# Patient Record
Sex: Female | Born: 1975 | Race: Black or African American | Hispanic: No | Marital: Married | Smoking: Never smoker
Health system: Southern US, Community
[De-identification: ages and names within clinical notes are randomized; demographics above are authoritative.]

## PROBLEM LIST (undated history)

## (undated) DIAGNOSIS — I10 Essential (primary) hypertension: Secondary | ICD-10-CM

## (undated) DIAGNOSIS — D219 Benign neoplasm of connective and other soft tissue, unspecified: Secondary | ICD-10-CM

## (undated) HISTORY — DX: Essential (primary) hypertension: I10

## (undated) HISTORY — DX: Benign neoplasm of connective and other soft tissue, unspecified: D21.9

---

## 2001-09-06 ENCOUNTER — Other Ambulatory Visit: Admission: RE | Admit: 2001-09-06 | Discharge: 2001-09-06 | Payer: Self-pay | Admitting: Gynecology

## 2003-04-01 ENCOUNTER — Other Ambulatory Visit: Admission: RE | Admit: 2003-04-01 | Discharge: 2003-04-01 | Payer: Self-pay | Admitting: Gynecology

## 2003-07-11 DIAGNOSIS — I10 Essential (primary) hypertension: Secondary | ICD-10-CM

## 2003-07-11 HISTORY — DX: Essential (primary) hypertension: I10

## 2003-09-16 ENCOUNTER — Inpatient Hospital Stay (HOSPITAL_COMMUNITY): Admission: AD | Admit: 2003-09-16 | Discharge: 2003-09-20 | Payer: Self-pay | Admitting: Gynecology

## 2003-09-17 ENCOUNTER — Encounter (INDEPENDENT_AMBULATORY_CARE_PROVIDER_SITE_OTHER): Payer: Self-pay | Admitting: *Deleted

## 2003-11-11 ENCOUNTER — Other Ambulatory Visit: Admission: RE | Admit: 2003-11-11 | Discharge: 2003-11-11 | Payer: Self-pay | Admitting: Gynecology

## 2005-01-11 ENCOUNTER — Other Ambulatory Visit: Admission: RE | Admit: 2005-01-11 | Discharge: 2005-01-11 | Payer: Self-pay | Admitting: Gynecology

## 2006-02-23 ENCOUNTER — Other Ambulatory Visit: Admission: RE | Admit: 2006-02-23 | Discharge: 2006-02-23 | Payer: Self-pay | Admitting: Gynecology

## 2006-03-27 ENCOUNTER — Encounter: Admission: RE | Admit: 2006-03-27 | Discharge: 2006-03-27 | Payer: Self-pay | Admitting: Gynecology

## 2007-06-13 ENCOUNTER — Other Ambulatory Visit: Admission: RE | Admit: 2007-06-13 | Discharge: 2007-06-13 | Payer: Self-pay | Admitting: Gynecology

## 2008-06-26 ENCOUNTER — Encounter: Payer: Self-pay | Admitting: Women's Health

## 2008-06-26 ENCOUNTER — Ambulatory Visit (HOSPITAL_BASED_OUTPATIENT_CLINIC_OR_DEPARTMENT_OTHER): Admission: RE | Admit: 2008-06-26 | Discharge: 2008-06-26 | Payer: Self-pay | Admitting: Gynecology

## 2008-06-26 ENCOUNTER — Ambulatory Visit: Payer: Self-pay | Admitting: Diagnostic Radiology

## 2008-06-26 ENCOUNTER — Ambulatory Visit: Payer: Self-pay | Admitting: Women's Health

## 2008-06-26 ENCOUNTER — Other Ambulatory Visit: Admission: RE | Admit: 2008-06-26 | Discharge: 2008-06-26 | Payer: Self-pay | Admitting: Gynecology

## 2009-07-22 ENCOUNTER — Ambulatory Visit: Payer: Self-pay | Admitting: Women's Health

## 2009-07-22 ENCOUNTER — Other Ambulatory Visit: Admission: RE | Admit: 2009-07-22 | Discharge: 2009-07-22 | Payer: Self-pay | Admitting: Gynecology

## 2010-07-31 ENCOUNTER — Encounter: Payer: Self-pay | Admitting: Gynecology

## 2010-08-03 ENCOUNTER — Ambulatory Visit
Admission: RE | Admit: 2010-08-03 | Discharge: 2010-08-03 | Payer: Self-pay | Source: Home / Self Care | Attending: Women's Health | Admitting: Women's Health

## 2010-08-03 ENCOUNTER — Other Ambulatory Visit
Admission: RE | Admit: 2010-08-03 | Discharge: 2010-08-03 | Payer: Self-pay | Source: Home / Self Care | Admitting: Gynecology

## 2010-08-03 ENCOUNTER — Other Ambulatory Visit: Payer: Self-pay | Admitting: Women's Health

## 2010-11-25 NOTE — Op Note (Signed)
NAME:  Theresa Noble, Theresa Noble [facility]                ACCOUNT NO.:  [id number]   MEDICAL RECORD NO.:  [id number]                   PATIENT TYPE:  INP   LOCATION:  9161                                 FACILITY:  [facility]   PHYSICIAN:  Timothy P. Fontaine, M.D.           DATE OF BIRTH:  May 28, 1976   DATE OF PROCEDURE:  09/17/2003  DATE OF DISCHARGE:                                 OPERATIVE REPORT   PREOPERATIVE DIAGNOSIS:  1. Pregnancy at term.  2. Failure to progress.  3. Intrapartum fever.   POSTOPERATIVE DIAGNOSIS:  1. Pregnancy at term.  2. Failure to progress.  3. Intrapartum fever.   PROCEDURE:  Primary low transverse cervical cesarean section.   SURGEON:  Timothy P. Fontaine, M.D.   ASSISTANT:  Scrub technician   ANESTHESIA:  Epidural.   ESTIMATED BLOOD LOSS:  Less than 500 mL.   COMPLICATIONS:  None.   SPECIMENS:  Samples of cord blood, placenta, umbilical cord.   FINDINGS:  At 1356, normal female, Apgars 9 and 9, nuchal cord x 1 loose  noted.  Pelvic anatomy noted to be normal.  There was a small subserosal  posterior uterine wall myoma 5 mm.  Bilateral fallopian tubes and ovaries  were grossly normal.   PROCEDURE:  The patient was taken to the operating room, had her epidural  catheter dosed, received an abdominal preparation with Betadine solution,  Foley catheter previously placed in labor and delivery, the patient was  draped in the usual fashion.  After assuring adequate anesthesia, the  abdomen was sharply entered through a Pfannenstiel incision achieving  adequate hemostasis at all levels.  The bladder flap was then sharply and  bluntly developed without difficulty and the uterus was sharply entered and  the lower uterine segment bluntly extended laterally.  The bulging membranes  were ruptured, the fluid noted to be clear.  The infants head was delivered  through the incision and the nares and mouth were suctioned.  The nuchal  cord was reduced.  The rest of the  infant was delivered.  The cord was  doubly clamped and cut.  The infant was handed to pediatrics in attendance.  Samples of cord blood were obtained.  The placenta was spontaneously  extruded and noted to be intact and was sent to pathology.  The uterus was  exteriorized, the endometrial cavity explored with a sponge to remove all  placental membrane fragments.  The patient received 1 gram Ancef  prophylaxis.  The uterine incision was then closed in two layers using 0  Vicryl suture, first in a running interlocking stitch followed by an  imbricating stitch.  The uterus was returned to the abdomen which was  copiously irrigated.  A mid incisional point was noted to persistently ooze  and a figure-of-eight suture was placed to achieve excellent hemostasis.  The anterior fascia was reapproximated using 0 Vicryl suture in a running  stitch, the subcutaneous tissue were irrigated, and the skin was  reapproximated using 4-0  Vicryl in a running subcuticular stitch.  Steri-  Strips with Benzoin applied.  A sterile dressing was applied.  The patient  was taken to the recovery room in good condition having tolerated the  procedure well.                                               Timothy P. Audie Box, M.D.    TPF/MEDQ  D:  09/17/2003  T:  09/18/2003  Job:  [id number]

## 2010-11-25 NOTE — H&P (Signed)
NAME:  Theresa Noble, Theresa Noble [facility]                ACCOUNT NO.:  [id number]   MEDICAL RECORD NO.:  [id number]                   PATIENT TYPE:  INP   LOCATION:  9161                                 FACILITY:  [facility]   PHYSICIAN:  Juan H. Lily Peer, M.D.             DATE OF BIRTH:  [birth year]   DATE OF ADMISSION:  09/16/2003  DATE OF DISCHARGE:                                HISTORY & PHYSICAL   CHIEF COMPLAINT:  Elevated blood pressure and swelling.   HISTORY:  The patient is a 35 year old gravida 1, para 0, with a last  menstrual period December 26, 2002, estimated date of confinement October 02, 2003.  Patient currently 53 weeks' estimated gestational age, was seen in  the office for a routine prenatal visit today.  He weight was 183 pounds and  her blood pressure was found to be 130/90, and it was repeated x2.  She has  1+ proteinuria.  Review of her record indicated that since approximately 31  weeks she had been starting to complain of increasing amount of swelling,  and she has gained a total of 13 pounds over the past eight weeks, overall  weight gain in her pregnancy has been 41 pounds.  She denied any right upper  quadrant pain, no visual disturbances or headaches, and she is being  admitted for induction.  Her cervix was long and closed, 50%, -3, and vertex  presentation.  Review of record indicated that she had had a PIH panel done  on March 2 with normal pregnancy parameters.  She does have positive group B  strep culture.   PAST MEDICAL HISTORY:  The patient denies any allergies.  She does have  positive group B strep with this pregnancy.  She had declined cystic  fibrosis screen as well as alpha-fetoprotein.  She denies any other medical  problems.  She does have a normal hemoglobin electrophoresis that was done  due to her ethnicity.   REVIEW OF SYSTEMS:  See [employer] form.   PHYSICAL EXAMINATION:  VITAL SIGNS:  As described above.  HEENT:  Unremarkable.  NECK:  Supple,  trachea midline, no carotid bruits, no thyromegaly.  CHEST:  Lungs clear to auscultation without rhonchi or wheezes.  CARDIAC:  Regular rate and rhythm, no murmurs or gallops.  BREASTS:  Exam not done.  ABDOMEN:  Gravid uterus, vertex presentation by [employer].  Positive  fetal heart tones.  PELVIC:  Cervix long and closed, posterior, -3 station.  EXTREMITIES:  1+ pitting edema.  DTR 1+.  Negative clonus.   PRENATAL LABORATORY DATA:  B positive blood type, negative antibody screen.  VDRL was nonreactive.  Rubella with evidence of immunity.  Hepatitis B  surface antigen and HIV were negative.  Alpha-fetoprotein was declined, as  was cystic fibrosis screen.  She had a normal hemoglobin electrophoresis.  Diabetes screen was normal.  GBS culture was positive.   ASSESSMENT:  A 35 year old gravida 1, para 0, at 58 weeks' estimated  gestational age with pregnancy-induced hypertension.  Will be admitted for  induction via Cervidil for cervical ripening.  Admitting PIH labs will be  drawn and after 12 hours, will proceed then with Pitocin high-dose for  induction.  Risks and benefits and pros and cons  discussed.  Also, due to the fast that she has a positive group B strep  culture, will start her during her active phase labor on penicillin G per  protocol.   PLAN:  As per assessment above.                                               Juan H. Lily Peer, M.D.    JHF/MEDQ  D:  09/16/2003  T:  09/16/2003  Job:  [id number]

## 2010-11-25 NOTE — Discharge Summary (Signed)
NAME:  Theresa Noble, BELKNAP [facility]                ACCOUNT NO.:  [id number]   MEDICAL RECORD NO.:  [id number]                   PATIENT TYPE:  INP   LOCATION:  9104                                 [facility]ACILITY:  [facility]   PHYSICIAN:  Ivor Costa. Farrel Gobble, M.D.              DATE OF BIRTH:  1975/10/26   DATE OF ADMISSION:  09/16/2003  DATE OF DISCHARGE:  09/20/2003                                 DISCHARGE SUMMARY   DISCHARGE DIAGNOSES:  1. Intrauterine pregnancy at 38 weeks, delivered.  2. Pregnancy-induced hypertension.  3. Positive group B Streptococcus.  4. Failure to progress.  5. Intrapartum fever.  6. Status post primary low transverse cervical cesarean section by Dr.     Colin Broach on September 17, 2003.   HISTORY:  This is a 27-years-of-age female gravida 1 para 0 with an EDC of  October 02, 2003.  Prenatal course had been complicated by PIH.  She did have  a positive group B strep.   HOSPITAL COURSE:  On September 16, 2003 the patient had been seen at the office,  was found to have blood pressure 130/90 with edema and 1+ proteinuria, and  therefore the patient was admitted for induction, was given Cervidil, and  after 12 hours Pitocin was begun.  Because of the positive group B strep was  begun on IV penicillin per protocol.  On September 17, 2003 the patient's  temperature was 101.6.  Subsequently, the patient was diagnosed with failure  to progress and intrapartum fever and therefore the patient underwent a  primary low transverse cervical cesarean section by Dr. Colin Broach on  September 17, 2003 and underwent delivery of a female, Apgars of 9 and 9, weight  of 6 pounds 6 ounces.  There were no complications.  Postoperatively she did  have a Tmax of 103 on September 18, 2003; otherwise, was doing well.  On September 19, 2003 the patient was afebrile, voiding, in stable condition, and  continued to do well and felt stable for discharge to home on September 20, 2003  in satisfactory condition.   ACCESSORY CLINICAL FINDINGS/LABORATORY DATA:  The patient is B positive,  rubella immune.  On September 18, 2003 hemoglobin was 10.5.   DISPOSITION:  The patient was discharged to home, follow up in 6 weeks, if  had any problem prior to that time to be seen in the office, was given  prescription for Percocet p.r.n. pain #30.     Susa Loffler, P.A.                    Ivor Costa. Farrel Gobble, M.D.    TSG/MEDQ  D:  10/12/2003  T:  10/12/2003  Job:  [id number]

## 2011-06-09 ENCOUNTER — Emergency Department (HOSPITAL_BASED_OUTPATIENT_CLINIC_OR_DEPARTMENT_OTHER)
Admission: EM | Admit: 2011-06-09 | Discharge: 2011-06-10 | Disposition: A | Discharge status: Home / Self Care | Payer: BC Managed Care – PPO | Attending: Emergency Medicine | Admitting: Emergency Medicine

## 2011-06-09 DIAGNOSIS — H5710 Ocular pain, unspecified eye: Secondary | ICD-10-CM | POA: Insufficient documentation

## 2011-06-09 DIAGNOSIS — H1130 Conjunctival hemorrhage, unspecified eye: Secondary | ICD-10-CM | POA: Insufficient documentation

## 2011-06-09 MED ORDER — FLUORESCEIN SODIUM 1 MG OP STRP
ORAL_STRIP | OPHTHALMIC | Status: AC
  Filled 2011-06-09: qty 1

## 2011-06-09 MED ORDER — TETRACAINE HCL 0.5 % OP SOLN
OPHTHALMIC | Status: AC
  Filled 2011-06-09: qty 2

## 2011-06-09 NOTE — ED Notes (Signed)
Pt states that she saw a brown spot on her eye about a week ago, today she presents with red area at the top of her R eye.  Pt states that she does not have a regular eye doctor.  No visual deficits reported.

## 2011-06-10 NOTE — ED Notes (Signed)
MD at bedside. 

## 2011-06-10 NOTE — ED Provider Notes (Addendum)
History     CSN: [id number] Arrival date & time: 06/09/2011 11:45 PM   First MD Initiated Contact with Patient 06/10/11 0043      Chief Complaint  Patient presents with  . Eye Problem     HPI  35yoF previously healthy presents with a complaint. The patient states that approximately one week ago she saw a red spot on her conjunctiva of right eye. It is not painful. She states that throughout last week she has experienced a cough which is improving. She states that since that time it has spread. She wears glasses but no contact lenses. She did not have a foreign body sensation in eye. She is not having floaters or pain in her eye. She denies photophobia. She states that she had intermittent blurred vision yesterday but her vision has returned to normal. Denies trauma to the eye. She denies fever, chills, drainage. No sick contacts.   ED Notes, ED Provider Notes from 06/09/11 0000 to 06/09/11 23:26:12       Maryruth Hancock, RN 06/09/2011 23:23      Pt states that she saw a brown spot on her eye about a week ago, today she presents with red area at the top of her R eye. Pt states that she does not have a regular eye doctor. No visual deficits reported.      History reviewed. No pertinent past medical history.  History reviewed. No pertinent past surgical history.  History reviewed. No pertinent family history.  History  Substance Use Topics  . Smoking status: Never Smoker   . Smokeless tobacco: Never Used  . Alcohol Use: No    OB History    Grav Para Term Preterm Abortions TAB SAB Ect Mult Living                  Review of Systems  All other systems reviewed and are negative.   Negative except as noted history of present illness Allergies  Review of patient's allergies indicates no known allergies.  Home Medications   Current Outpatient Rx  Name Route Sig Dispense Refill  . ONE-DAILY MULTI VITAMINS PO TABS Oral Take 1 tablet by mouth daily.        BP 139/63   Pulse 60  Temp(Src) 98.1 F (36.7 C) (Oral)  Resp 16  Ht 5\' 5"  (1.651 m)  Wt 150 lb (68.04 kg)  BMI 24.96 kg/m2  SpO2 100%  LMP 06/02/2011  Physical Exam  Nursing note and vitals reviewed. Constitutional: She is oriented to person, place, and time. She appears well-developed.  HENT:  Head: Atraumatic.  Mouth/Throat: Oropharynx is clear and moist.  Eyes: Conjunctivae and EOM are normal. Pupils are equal, round, and reactive to light. Right eye exhibits no discharge. Left eye exhibits no discharge. No scleral icterus.       Rt eye with well demarcated area of extravasated blood superior to and medial to limbus Neg fluoro stain under UV light Inverted eye lid neg FB  Neck: Normal range of motion. Neck supple.  Cardiovascular: Normal rate, regular rhythm, normal heart sounds and intact distal pulses.   Pulmonary/Chest: Effort normal and breath sounds normal. No respiratory distress. She has no wheezes. She has no rales.  Abdominal: Soft. She exhibits no distension. There is no tenderness. There is no rebound and no guarding.  Musculoskeletal: Normal range of motion.  Neurological: She is alert and oriented to person, place, and time.  Skin: Skin is warm and dry.  Psychiatric: She has  a normal mood and affect.    ED Course  Procedures (including critical care time)  Labs Reviewed - No data to display No results found.   1. Subconjunctival hemorrhage     MDM  Subconjunctival hemorrhage after coughing. Appears well. Good visual acuity.  F/U as needed         Forbes Cellar, MD 06/10/11 [id number]  Forbes Cellar, MD 06/10/11 [phone]

## 2011-08-11 ENCOUNTER — Other Ambulatory Visit: Payer: Self-pay | Admitting: Gynecology

## 2011-10-06 ENCOUNTER — Other Ambulatory Visit: Payer: Self-pay | Admitting: Gynecology

## 2011-10-15 ENCOUNTER — Other Ambulatory Visit: Payer: Self-pay | Admitting: Gynecology

## 2011-10-16 ENCOUNTER — Other Ambulatory Visit: Payer: Self-pay | Admitting: *Deleted

## 2011-11-12 ENCOUNTER — Other Ambulatory Visit: Payer: Self-pay | Admitting: Gynecology

## 2011-11-16 ENCOUNTER — Encounter: Payer: Self-pay | Admitting: Women's Health

## 2011-11-16 ENCOUNTER — Ambulatory Visit (INDEPENDENT_AMBULATORY_CARE_PROVIDER_SITE_OTHER): Payer: BC Managed Care – PPO | Admitting: Women's Health

## 2011-11-16 VITALS — BP 124/78 | Ht 66.0 in | Wt 177.0 lb

## 2011-11-16 DIAGNOSIS — IMO0001 Reserved for inherently not codable concepts without codable children: Secondary | ICD-10-CM

## 2011-11-16 DIAGNOSIS — Z01419 Encounter for gynecological examination (general) (routine) without abnormal findings: Secondary | ICD-10-CM

## 2011-11-16 DIAGNOSIS — Z309 Encounter for contraceptive management, unspecified: Secondary | ICD-10-CM

## 2011-11-16 LAB — CBC WITH DIFFERENTIAL/PLATELET
Basophils Absolute: 0 10*3/uL (ref 0.0–0.1)
Basophils Relative: 1 % (ref 0–1)
MCHC: 33.7 g/dL (ref 30.0–36.0)
Monocytes Absolute: 0.9 10*3/uL (ref 0.1–1.0)
Neutro Abs: 2.1 10*3/uL (ref 1.7–7.7)
Neutrophils Relative %: 32 % — ABNORMAL LOW (ref 43–77)
RDW: 13 % (ref 11.5–15.5)

## 2011-11-16 MED ORDER — NORGESTIM-ETH ESTRAD TRIPHASIC 0.18/0.215/0.25 MG-35 MCG PO TABS: 1.0000 | ORAL_TABLET | Freq: Every day | ORAL | Status: DC

## 2011-11-16 NOTE — Progress Notes (Signed)
Theresa Noble [facility] 29-Jan-1976 [id number]    History:    The patient presents for annual exam.  Monthly 5 day cycle on tri sprintec with no complaints. History of normal Paps and mammograms.   Past medical history, past surgical history, family history and social history were all reviewed and documented in the EPIC chart. Mother diagnosed with breast cancer died in her 1s related to complications from chemotherapy. Has custody of stepdaughter who is 35 with type 1 diabetes doing well. Daughter Theresa Noble 8 doing well.   ROS:  A  ROS was performed and pertinent positives and negatives are included in the history.  Exam:  Filed Vitals:   11/16/11 0918  BP: 124/78    General appearance:  Normal Head/Neck:  Normal, without cervical or supraclavicular adenopathy. Thyroid:  Symmetrical, normal in size, without palpable masses or nodularity. Respiratory  Effort:  Normal  Auscultation:  Clear without wheezing or rhonchi Cardiovascular  Auscultation:  Regular rate, without rubs, murmurs or gallops  Edema/varicosities:  Not grossly evident Abdominal  Soft,nontender, without masses, guarding or rebound.  Liver/spleen:  No organomegaly noted  Hernia:  None appreciated  Skin  Inspection:  Grossly normal  Palpation:  Grossly normal Neurologic/psychiatric  Orientation:  Normal with appropriate conversation.  Mood/affect:  Normal  Genitourinary    Breasts: Examined lying and sitting.     Right: Without masses, retractions, discharge or axillary adenopathy.     Left: Without masses, retractions, discharge or axillary adenopathy.   Inguinal/mons:  Normal without inguinal adenopathy  External genitalia:  Normal  BUS/Urethra/Skene's glands:  Normal  Bladder:  Normal  Vagina:  Normal  Cervix:  Normal  Uterus:   normal in size, shape and contour.  Midline and mobile  Adnexa/parametria:     Rt: Without masses or tenderness.   Lt: Without masses or tenderness.  Anus and  perineum: Normal  Digital rectal exam: Normal sphincter tone without palpated masses or tenderness  Assessment/Plan:  36 y.o. MBF G 1 P1 for annual exam with no complaints.  Normal GYN exam on tri-Sprintec without complaint  Plan: Tri-Sprintec prescription, proper use, slight risk for blood clots and strokes reviewed. SBE's, annual mammogram due to mother's history, calcium rich diet, exercise, decreased calories for weight loss encouraged. CBC, UA , normal lipid profile last year. ACOG's  Pap screening recommendations reviewed.    Harrington Challenger [facility], 11:29 AM 11/16/2011

## 2011-11-16 NOTE — Patient Instructions (Signed)

## 2011-11-17 LAB — URINALYSIS W MICROSCOPIC + REFLEX CULTURE
Bilirubin Urine: NEGATIVE
Protein, ur: NEGATIVE mg/dL
Squamous Epithelial / LPF: NONE SEEN
Urobilinogen, UA: 0.2 mg/dL (ref 0.0–1.0)

## 2011-11-20 ENCOUNTER — Telehealth: Payer: Self-pay | Admitting: Gynecology

## 2011-11-20 MED ORDER — SULFAMETHOXAZOLE-TRIMETHOPRIM 800-160 MG PO TABS: 1.0000 | ORAL_TABLET | Freq: Two times a day (BID) | ORAL | Status: AC

## 2011-11-20 NOTE — Telephone Encounter (Signed)
Tell patient urine grew out bacteria consistent with UTI and we will treat her with a short course of antibiotics. Prescription written.

## 2011-11-20 NOTE — Telephone Encounter (Signed)
Pt informed with the below note. 

## 2011-11-20 NOTE — Telephone Encounter (Signed)
Left message for pt to call.

## 2011-11-22 LAB — URINE CULTURE: Colony Count: >=100000

## 2013-05-14 ENCOUNTER — Other Ambulatory Visit: Payer: Self-pay | Admitting: Gynecology

## 2013-05-14 DIAGNOSIS — Z1231 Encounter for screening mammogram for malignant neoplasm of breast: Secondary | ICD-10-CM

## 2013-05-16 ENCOUNTER — Ambulatory Visit (HOSPITAL_BASED_OUTPATIENT_CLINIC_OR_DEPARTMENT_OTHER)
Admission: RE | Admit: 2013-05-16 | Discharge: 2013-05-16 | Disposition: A | Discharge status: Home / Self Care | Payer: BC Managed Care – PPO | Source: Ambulatory Visit | Attending: Gynecology | Admitting: Gynecology

## 2013-05-16 DIAGNOSIS — Z1231 Encounter for screening mammogram for malignant neoplasm of breast: Secondary | ICD-10-CM | POA: Insufficient documentation

## 2014-05-11 ENCOUNTER — Encounter: Payer: Self-pay | Admitting: Women's Health

## 2018-02-26 ENCOUNTER — Ambulatory Visit: Payer: BC Managed Care – PPO | Admitting: Women's Health

## 2018-02-26 ENCOUNTER — Encounter: Payer: Self-pay | Admitting: Women's Health

## 2018-02-26 VITALS — BP 122/80 | Ht 65.0 in | Wt 187.0 lb

## 2018-02-26 DIAGNOSIS — N852 Hypertrophy of uterus: Secondary | ICD-10-CM

## 2018-02-26 DIAGNOSIS — Z01411 Encounter for gynecological examination (general) (routine) with abnormal findings: Secondary | ICD-10-CM | POA: Diagnosis not present

## 2018-02-26 DIAGNOSIS — Z01419 Encounter for gynecological examination (general) (routine) without abnormal findings: Secondary | ICD-10-CM

## 2018-02-26 NOTE — Patient Instructions (Addendum)
Breast center  [phone]  Health Maintenance, Female Adopting a healthy lifestyle and getting preventive care can go a long way to promote health and wellness. Talk with your health care provider about what schedule of regular examinations is right for you. This is a good chance for you to check in with your provider about disease prevention and staying healthy. In between checkups, there are plenty of things you can do on your own. Experts have done a lot of research about which lifestyle changes and preventive measures are most likely to keep you healthy. Ask your health care provider for more information. Weight and diet Eat a healthy diet  Be sure to include plenty of vegetables, fruits, low-fat dairy products, and lean protein.  Do not eat a lot of foods high in solid fats, added sugars, or salt.  Get regular exercise. This is one of the most important things you can do for your health. ? Most adults should exercise for at least 150 minutes each week. The exercise should increase your heart rate and make you sweat (moderate-intensity exercise). ? Most adults should also do strengthening exercises at least twice a week. This is in addition to the moderate-intensity exercise.  Maintain a healthy weight  Body mass index (BMI) is a measurement that can be used to identify possible weight problems. It estimates body fat based on height and weight. Your health care provider can help determine your BMI and help you achieve or maintain a healthy weight.  For females 33 years of age and older: ? A BMI below 18.5 is considered underweight. ? A BMI of 18.5 to 24.9 is normal. ? A BMI of 25 to 29.9 is considered overweight. ? A BMI of 30 and above is considered obese.  Watch levels of cholesterol and blood lipids  You should start having your blood tested for lipids and cholesterol at 42 years of age, then have this test every 5 years.  You may need to have your cholesterol levels checked more  often if: ? Your lipid or cholesterol levels are high. ? You are older than 42 years of age. ? You are at high risk for heart disease.  Cancer screening Lung Cancer  Lung cancer screening is recommended for adults 90-62 years old who are at high risk for lung cancer because of a history of smoking.  A yearly low-dose CT scan of the lungs is recommended for people who: ? Currently smoke. ? Have quit within the past 15 years. ? Have at least a 30-pack-year history of smoking. A pack year is smoking an average of one pack of cigarettes a day for 1 year.  Yearly screening should continue until it has been 15 years since you quit.  Yearly screening should stop if you develop a health problem that would prevent you from having lung cancer treatment.  Breast Cancer  Practice breast self-awareness. This means understanding how your breasts normally appear and feel.  It also means doing regular breast self-exams. Let your health care provider know about any changes, no matter how small.  If you are in your 20s or 30s, you should have a clinical breast exam (CBE) by a health care provider every 1-3 years as part of a regular health exam.  If you are 47 or older, have a CBE every year. Also consider having a breast X-ray (mammogram) every year.  If you have a family history of breast cancer, talk to your health care provider about genetic screening.  If  you are at high risk for breast cancer, talk to your health care provider about having an MRI and a mammogram every year.  Breast cancer gene (BRCA) assessment is recommended for women who have family members with BRCA-related cancers. BRCA-related cancers include: ? Breast. ? Ovarian. ? Tubal. ? Peritoneal cancers.  Results of the assessment will determine the need for genetic counseling and BRCA1 and BRCA2 testing.  Cervical Cancer Your health care provider may recommend that you be screened regularly for cancer of the pelvic organs  (ovaries, uterus, and vagina). This screening involves a pelvic examination, including checking for microscopic changes to the surface of your cervix (Pap test). You may be encouraged to have this screening done every 3 years, beginning at age 21.  For women ages 30-65, health care providers may recommend pelvic exams and Pap testing every 3 years, or they may recommend the Pap and pelvic exam, combined with testing for human papilloma virus (HPV), every 5 years. Some types of HPV increase your risk of cervical cancer. Testing for HPV may also be done on women of any age with unclear Pap test results.  Other health care providers may not recommend any screening for nonpregnant women who are considered low risk for pelvic cancer and who do not have symptoms. Ask your health care provider if a screening pelvic exam is right for you.  If you have had past treatment for cervical cancer or a condition that could lead to cancer, you need Pap tests and screening for cancer for at least 20 years after your treatment. If Pap tests have been discontinued, your risk factors (such as having a new sexual partner) need to be reassessed to determine if screening should resume. Some women have medical problems that increase the chance of getting cervical cancer. In these cases, your health care provider may recommend more frequent screening and Pap tests.  Colorectal Cancer  This type of cancer can be detected and often prevented.  Routine colorectal cancer screening usually begins at 42 years of age and continues through 42 years of age.  Your health care provider may recommend screening at an earlier age if you have risk factors for colon cancer.  Your health care provider may also recommend using home test kits to check for hidden blood in the stool.  A small camera at the end of a tube can be used to examine your colon directly (sigmoidoscopy or colonoscopy). This is done to check for the earliest forms of  colorectal cancer.  Routine screening usually begins at age 50.  Direct examination of the colon should be repeated every 5-10 years through 42 years of age. However, you may need to be screened more often if early forms of precancerous polyps or small growths are found.  Skin Cancer  Check your skin from head to toe regularly.  Tell your health care provider about any new moles or changes in moles, especially if there is a change in a mole's shape or color.  Also tell your health care provider if you have a mole that is larger than the size of a pencil eraser.  Always use sunscreen. Apply sunscreen liberally and repeatedly throughout the day.  Protect yourself by wearing long sleeves, pants, a wide-brimmed hat, and sunglasses whenever you are outside.  Heart disease, diabetes, and high blood pressure  High blood pressure causes heart disease and increases the risk of stroke. High blood pressure is more likely to develop in: ? People who have blood pressure   in the high end of the normal range (130-139/85-89 mm Hg). ? People who are overweight or obese. ? People who are African American.  If you are 28-40 years of age, have your blood pressure checked every 3-5 years. If you are 66 years of age or older, have your blood pressure checked every year. You should have your blood pressure measured twice-once when you are at a hospital or clinic, and once when you are not at a hospital or clinic. Record the average of the two measurements. To check your blood pressure when you are not at a hospital or clinic, you can use: ? An automated blood pressure machine at a pharmacy. ? A home blood pressure monitor.  If you are between 22 years and 24 years old, ask your health care provider if you should take aspirin to prevent strokes.  Have regular diabetes screenings. This involves taking a blood sample to check your fasting blood sugar level. ? If you are at a normal weight and have a low risk  for diabetes, have this test once every three years after 42 years of age. ? If you are overweight and have a high risk for diabetes, consider being tested at a younger age or more often. Preventing infection Hepatitis B  If you have a higher risk for hepatitis B, you should be screened for this virus. You are considered at high risk for hepatitis B if: ? You were born in a country where hepatitis B is common. Ask your health care provider which countries are considered high risk. ? Your parents were born in a high-risk country, and you have not been immunized against hepatitis B (hepatitis B vaccine). ? You have HIV or AIDS. ? You use needles to inject street drugs. ? You live with someone who has hepatitis B. ? You have had sex with someone who has hepatitis B. ? You get hemodialysis treatment. ? You take certain medicines for conditions, including cancer, organ transplantation, and autoimmune conditions.  Hepatitis C  Blood testing is recommended for: ? Everyone born from [birth year] through 1965. ? Anyone with known risk factors for hepatitis C.  Sexually transmitted infections (STIs)  You should be screened for sexually transmitted infections (STIs) including gonorrhea and chlamydia if: ? You are sexually active and are younger than 42 years of age. ? You are older than 42 years of age and your health care provider tells you that you are at risk for this type of infection. ? Your sexual activity has changed since you were last screened and you are at an increased risk for chlamydia or gonorrhea. Ask your health care provider if you are at risk.  If you do not have HIV, but are at risk, it may be recommended that you take a prescription medicine daily to prevent HIV infection. This is called pre-exposure prophylaxis (PrEP). You are considered at risk if: ? You are sexually active and do not regularly use condoms or know the HIV status of your partner(s). ? You take drugs by  injection. ? You are sexually active with a partner who has HIV.  Talk with your health care provider about whether you are at high risk of being infected with HIV. If you choose to begin PrEP, you should first be tested for HIV. You should then be tested every 3 months for as long as you are taking PrEP. Pregnancy  If you are premenopausal and you may become pregnant, ask your health care provider about preconception counseling.  If you may become pregnant, take 400 to 800 micrograms (mcg) of folic acid every day.  If you want to prevent pregnancy, talk to your health care provider about birth control (contraception). Osteoporosis and menopause  Osteoporosis is a disease in which the bones lose minerals and strength with aging. This can result in serious bone fractures. Your risk for osteoporosis can be identified using a bone density scan.  If you are 17 years of age or older, or if you are at risk for osteoporosis and fractures, ask your health care provider if you should be screened.  Ask your health care provider whether you should take a calcium or vitamin D supplement to lower your risk for osteoporosis.  Menopause may have certain physical symptoms and risks.  Hormone replacement therapy may reduce some of these symptoms and risks. Talk to your health care provider about whether hormone replacement therapy is right for you. Follow these instructions at home:  Schedule regular health, dental, and eye exams.  Stay current with your immunizations.  Do not use any tobacco products including cigarettes, chewing tobacco, or electronic cigarettes.  If you are pregnant, do not drink alcohol.  If you are breastfeeding, limit how much and how often you drink alcohol.  Limit alcohol intake to no more than 1 drink per day for nonpregnant women. One drink equals 12 ounces of beer, 5 ounces of wine, or 1 ounces of hard liquor.  Do not use street drugs.  Do not share needles.  Ask  your health care provider for help if you need support or information about quitting drugs.  Tell your health care provider if you often feel depressed.  Tell your health care provider if you have ever been abused or do not feel safe at home. This information is not intended to replace advice given to you by your health care provider. Make sure you discuss any questions you have with your health care provider. Document Released: 01/09/2011 Document Revised: 12/02/2015 Document Reviewed: 03/30/2015 Elsevier Interactive Patient Education  [employer].

## 2018-02-26 NOTE — Progress Notes (Signed)
Theresa Noble [facility] 1975/11/07 [id number]    History:    Presents for annual exam.  Has been greater than 5 years since she has been to the office.  Normal Pap and mammogram history.  Monthly cycle/withdrawal.  Mother breast cancer  in her 63s, deceased.  Father deceased leukemia.  Past medical history, past surgical history, family history and social history were all reviewed and documented in the EPIC chart.  Works for the school system, completing PhD.  One daughter age 41.  2 stepdaughters one with  type 1 diabetes now 48.  ROS:  A ROS was performed and pertinent positives and negatives are included.  Exam:  Vitals:   02/26/18 0850  BP: 122/80  Weight: 187 lb (84.8 kg)  Height: 5\' 5"  (1.651 m)   Body mass index is 31.12 kg/m.   General appearance:  Normal Thyroid:  Symmetrical, normal in size, without palpable masses or nodularity. Respiratory  Auscultation:  Clear without wheezing or rhonchi Cardiovascular  Auscultation:  Regular rate, without rubs, murmurs or gallops  Edema/varicosities:  Not grossly evident Abdominal  Soft,nontender, without masses, guarding or rebound.  Liver/spleen:  No organomegaly noted  Hernia:  None appreciated  Skin  Inspection:  Grossly normal   Breasts: Examined lying and sitting.     Right: Without masses, retractions, discharge or axillary adenopathy.     Left: Without masses, retractions, discharge or axillary adenopathy. Gentitourinary   Inguinal/mons:  Normal without inguinal adenopathy  External genitalia:  Normal  BUS/Urethra/Skene's glands:  Normal  Vagina:  Normal  Cervix:  Normal  Uterus:   Enlarged questionable fibroid , shape and contour.  Midline and mobile  Adnexa/parametria:     Rt: Without masses or tenderness.   Lt: Without masses or tenderness.  Anus and perineum: Normal  Digital rectal exam: Normal sphincter tone without palpated masses or tenderness  Assessment/Plan:  42 y.o. MPF G1, P1 for annual exam with no  complaints.  Monthly cycle/withdrawal Enlarged uterus questionable fibroid Obesity Labs-primary care reports as normal  Plan: Reviewed uterus felt enlarged, ultrasound.  SBE's, annual screening mammogram, overdue instructed to schedule.  Breast center information given.  Contraception reviewed, declines will continue withdrawal.  Exercise, calcium rich foods, vitamin D 1000 daily encouraged.  Reviewed importance of decreasing calorie/carbs.  Pap with HR HPV typing, new screening guidelines reviewed.    Faulk, 8:56 AM 02/26/2018

## 2018-02-26 NOTE — Addendum Note (Signed)
Addended by: Lorine Bears on: 02/26/2018 09:28 AM   Modules accepted: Orders

## 2018-02-27 LAB — PAP, TP IMAGING W/ HPV RNA, RFLX HPV TYPE 16,18/45: HPV DNA HIGH RISK: NOT DETECTED

## 2018-06-24 ENCOUNTER — Other Ambulatory Visit: Payer: Self-pay | Admitting: Gynecology

## 2018-06-24 ENCOUNTER — Ambulatory Visit: Payer: BC Managed Care – PPO | Admitting: Gynecology

## 2018-06-24 ENCOUNTER — Encounter: Payer: Self-pay | Admitting: Gynecology

## 2018-06-24 VITALS — BP 124/80

## 2018-06-24 DIAGNOSIS — N63 Unspecified lump in unspecified breast: Secondary | ICD-10-CM | POA: Diagnosis not present

## 2018-06-24 NOTE — Progress Notes (Signed)
    Lark Langenfeld [facility] [birth year] [id number]        42 y.o.  G1P0001 presents with 1 day history of right breast tenderness and palpable lump.  No history of same before.  Last mammogram 2014.  No nipple discharge.  LMP last week.  Past medical history,surgical history, problem list, medications, allergies, family history and social history were all reviewed and documented in the EPIC chart.  Directed ROS with pertinent positives and negatives documented in the history of present illness/assessment and plan.  Exam: Caryn Bee assistant Vitals:   06/24/18 1031  BP: 124/80   General appearance:  Normal Both breasts examined lying and sitting.  Left without masses, retractions, discharge, adenopathy.  Right with nodularity at the 4 o'clock position 2 fingerbreadths off the areola.  No well defined mass but more nodularity.  No overlying skin changes.  No nipple discharge or axillary adenopathy.  Assessment/Plan:  41 y.o. G1P0001 with new onset nodularity right breast as described.  Tender to palpation.  Reviewed differential with the patient to include normal glandular breast tissue, benign cysts, tumors.  Recommend diagnostic mammography and ultrasound.  Will arrange through the breast center and patient knows to expect a call to schedule within the week.  She will call if she does not hear from them.    Anastasio Auerbach MD, 10:41 AM 06/24/2018

## 2018-06-24 NOTE — Patient Instructions (Signed)
The mammography office will call you to arrange for the mammogram and ultrasound.  Call my office if you do not hear from them within 1 week.

## 2019-03-03 ENCOUNTER — Ambulatory Visit: Payer: BC Managed Care – PPO | Admitting: Women's Health

## 2019-03-04 NOTE — Progress Notes (Signed)
Erroneous encounter

## 2019-03-04 NOTE — Addendum Note (Signed)
Addended by: Alen Blew on: 03/04/2019 08:49 AM   Modules accepted: Level of Service

## 2019-04-01 ENCOUNTER — Encounter: Payer: Self-pay | Admitting: Gynecology

## 2019-07-25 ENCOUNTER — Other Ambulatory Visit: Payer: Self-pay | Admitting: Women's Health

## 2019-07-25 ENCOUNTER — Telehealth: Payer: Self-pay | Admitting: *Deleted

## 2019-07-25 DIAGNOSIS — Z1231 Encounter for screening mammogram for malignant neoplasm of breast: Secondary | ICD-10-CM

## 2019-07-25 NOTE — Telephone Encounter (Signed)
Patient called and left message c/o for 2 months having 2 cycles both months asked what to do regarding this? Also left message about breast imaging. Patient last annual exam was 02/2018 and last appointment regarding breast problem was 06/24/18 office visit. I called patient to relay to her she needs to schedule office visit for both issues, but her voicemail was full, not able to leave message.

## 2019-07-28 ENCOUNTER — Other Ambulatory Visit: Payer: Self-pay

## 2019-07-28 NOTE — Telephone Encounter (Signed)
Patient called back and I explained best to schedule office visit with provider, transferred to appointment desk to schedule visit for irregular cycles and annual exam.

## 2019-07-29 ENCOUNTER — Encounter: Payer: BC Managed Care – PPO | Admitting: Women's Health

## 2019-08-01 ENCOUNTER — Other Ambulatory Visit: Payer: Self-pay

## 2019-08-04 ENCOUNTER — Encounter: Payer: Self-pay | Admitting: Women's Health

## 2019-08-04 ENCOUNTER — Other Ambulatory Visit: Payer: Self-pay

## 2019-08-04 ENCOUNTER — Ambulatory Visit: Payer: BC Managed Care – PPO | Admitting: Women's Health

## 2019-08-04 VITALS — BP 118/78 | Ht 65.0 in | Wt 184.0 lb

## 2019-08-04 DIAGNOSIS — N852 Hypertrophy of uterus: Secondary | ICD-10-CM

## 2019-08-04 DIAGNOSIS — Z01419 Encounter for gynecological examination (general) (routine) without abnormal findings: Secondary | ICD-10-CM | POA: Diagnosis not present

## 2019-08-04 DIAGNOSIS — R350 Frequency of micturition: Secondary | ICD-10-CM

## 2019-08-04 DIAGNOSIS — N926 Irregular menstruation, unspecified: Secondary | ICD-10-CM

## 2019-08-04 LAB — PREGNANCY, URINE: Preg Test, Ur: NEGATIVE

## 2019-08-04 NOTE — Progress Notes (Signed)
Theresa Noble [facility] 12/18/1975 [id number]    History:    Presents for annual exam.  Cycles monthly until 2 months ago when they became every 2 to 3 weeks for 5 to 7 days.  No spotting, discharge, or fever.  Having increased urinary frequency without pain or burning, no change in bowel elimination.  Reports low abdominal pressure, discomfort for the past week.  Withdrawal for contraception.  Mother breast cancer in her 32s, deceased early 56s .  Father deceased from leukemia.  Normal Pap and mammogram history.  Has not had BRCA testing.  Past medical history, past surgical history, family history and social history were all reviewed and documented in the EPIC chart.  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  Theresa Noble 15 doing well has had Gardasil.  2 stepdaughters.  ROS:  A ROS was performed and pertinent positives and negatives are included.  Exam:  Vitals:   08/04/19 1155  BP: 118/78  Weight: 184 lb (83.5 kg)  Height: _0  (1.651 m)   Body mass index is 30.62 kg/m.   General appearance:  Normal Thyroid:  Symmetrical, normal in size, without palpable masses or nodularity. Respiratory  Auscultation:  Clear without wheezing or rhonchi Cardiovascular  Auscultation:  Regular rate, without rubs, murmurs or gallops  Edema/varicosities:  Not grossly evident Abdominal  Soft,nontender, without masses, guarding or rebound.  Liver/spleen:  No organomegaly noted  Hernia:  None appreciated  Skin  Inspection:  Grossly normal   Breasts: Examined lying and sitting.     Right: Without masses, retractions, discharge or axillary adenopathy.     Left: Without masses, retractions, discharge or axillary adenopathy. Gentitourinary   Inguinal/mons:  Normal without inguinal adenopathy  External genitalia:  Normal  BUS/Urethra/Skene's glands:  Normal  Vagina:  Normal  Cervix:  Normal  Uterus: Bulky, tenderness, pressure sensation.  Midline and mobile  Adnexa/parametria:     Rt: Without  masses or tenderness.   Lt: Without masses or tenderness.  Anus and perineum: Normal  Digital rectal exam: Normal sphincter tone without palpated masses or tenderness  Assessment/Plan:  44 y.o. MBF G1 P1 +2 stepdaughters for annual exam with increased urinary frequency, low abdominal tenderness with pressure for the past week.  UPT negative UA trace leukocytes, 10-20 WBCs, 3-10 RBCs, 20-40 squamous epithelials, many bacteria  Cycles monthly until 2 months ago when cycles increased in frequency to every 2 to 3 weeks.  Withdrawal for contraception Hypertension-primary care manages labs and meds Mother deceased metastatic breast cancer diagnosed in her 57s died early 94s  Plan: BRCA testing discussed, will think about it and call if would like to discuss with genetic counselor.  Schedule ultrasound.  Urine culture pending.  SBEs, keep scheduled mammogram appointment reviewed importance of annual screening.  Keep menstrual calendar, call if cycles more frequent than every 21 days or lasts greater than 7 days..  Encouraged regular exercise, calcium rich foods, vitamin D 1000 IUs daily.  Encouraged to decrease calorie/carbs weight is down 5 pounds from last year.  Declines contraception. Pap normal 2019, new screening guidelines reviewed. Congratulated on PhD.    Theresa Noble [facility], 12:05 PM 08/04/2019

## 2019-08-04 NOTE — Patient Instructions (Addendum)
It was good to see you today Vitamin D 1000 IUs daily BRCA Gene Testing Why am I having this test? BRCA gene testing is done to check for the presence of harmful changes (mutations) in the BRCA1 gene or the BRCA2 gene (breast cancer susceptibility genes). If there is a mutation, the genes may not be able to help repair damaged cells in the body. As a result, the damaged cells may develop defects that can lead to certain types of cancer. You may have this test if you have a family history of certain types of cancer, including cancer of the:  Breast.  Ovaries.  Fallopian tubes.  Peritoneum.  Pancreas.  Prostate. What kind of sample is taken?     The test requires either a sample of blood or a sample of cells from your saliva. If a sample of blood is needed, it will probably be collected by inserting a needle into a vein. If a sample of saliva is needed, you will get instructions about how to collect the sample. What do the results mean? The test results can show whether you have a mutation in the BRCA1 or BRCA2 gene that increases your risk for certain cancers. Meaning of negative test results A negative test result means that you do not have a mutation in the BRCA1 or BRCA2 gene that is known to increase your risk for certain cancers. This does not mean that you will never get cancer. Talk with your health care provider or a genetic counselor about what this result means for you. Meaning of positive test results A positive test result means that you have a mutation in the BRCA1 or BRCA2 gene that increases your risk for certain cancers. Women with a positive test result have an increased risk for breast and ovarian cancer. Both women and men with a mutation have an increased risk for breast cancer and may be at greater risk for other types of cancer. Getting a positive test result does not mean that you will develop cancer. Talk with your health care provider or a genetic counselor about  what this result means for you. You may be told that you are a carrier. This means that you can pass the mutation to your children. Meaning of ambiguous test results Ambiguous, inconclusive, or uncertain test results mean that there is a change in the BRCA1 or BRCA2 gene, but it is a change that has not been linked to cancer. Talk with your health care provider or a genetic counselor about what this result means for you. Talk with your health care provider to discuss your results, treatment options, and if necessary, the need for more tests. Talk with your health care provider if you have any questions about your results. How do I get my results? It is up to you to get your test results. Ask your health care provider, or the department that is doing the test, when your results will be ready. This information is not intended to replace advice given to you by your health care provider. Make sure you discuss any questions you have with your health care provider. Document Revised: 01/21/2018 Document Reviewed: 02/16/2016 Elsevier Patient Education  [place] Maintenance, Female Adopting a healthy lifestyle and getting preventive care are important in promoting health and wellness. Ask your health care provider about:  The right schedule for you to have regular tests and exams.  Things you can do on your own to prevent diseases and keep yourself healthy.  What should I know about diet, weight, and exercise? Eat a healthy diet   Eat a diet that includes plenty of vegetables, fruits, low-fat dairy products, and lean protein.  Do not eat a lot of foods that are high in solid fats, added sugars, or sodium. Maintain a healthy weight Body mass index (BMI) is used to identify weight problems. It estimates body fat based on height and weight. Your health care provider can help determine your BMI and help you achieve or maintain a healthy weight. Get regular exercise Get regular  exercise. This is one of the most important things you can do for your health. Most adults should:  Exercise for at least 150 minutes each week. The exercise should increase your heart rate and make you sweat (moderate-intensity exercise).  Do strengthening exercises at least twice a week. This is in addition to the moderate-intensity exercise.  Spend less time sitting. Even light physical activity can be beneficial. Watch cholesterol and blood lipids Have your blood tested for lipids and cholesterol at 44 years of age, then have this test every 5 years. Have your cholesterol levels checked more often if:  Your lipid or cholesterol levels are high.  You are older than 44 years of age.  You are at high risk for heart disease. What should I know about cancer screening? Depending on your health history and family history, you may need to have cancer screening at various ages. This may include screening for:  Breast cancer.  Cervical cancer.  Colorectal cancer.  Skin cancer.  Lung cancer. What should I know about heart disease, diabetes, and high blood pressure? Blood pressure and heart disease  High blood pressure causes heart disease and increases the risk of stroke. This is more likely to develop in people who have high blood pressure readings, are of African descent, or are overweight.  Have your blood pressure checked: ? Every 3-5 years if you are 60-53 years of age. ? Every year if you are 40 years old or older. Diabetes Have regular diabetes screenings. This checks your fasting blood sugar level. Have the screening done:  Once every three years after age 16 if you are at a normal weight and have a low risk for diabetes.  More often and at a younger age if you are overweight or have a high risk for diabetes. What should I know about preventing infection? Hepatitis B If you have a higher risk for hepatitis B, you should be screened for this virus. Talk with your health  care provider to find out if you are at risk for hepatitis B infection. Hepatitis C Testing is recommended for:  Everyone born from [birth year] through 1965.  Anyone with known risk factors for hepatitis C. Sexually transmitted infections (STIs)  Get screened for STIs, including gonorrhea and chlamydia, if: ? You are sexually active and are younger than 44 years of age. ? You are older than 44 years of age and your health care provider tells you that you are at risk for this type of infection. ? Your sexual activity has changed since you were last screened, and you are at increased risk for chlamydia or gonorrhea. Ask your health care provider if you are at risk.  Ask your health care provider about whether you are at high risk for HIV. Your health care provider may recommend a prescription medicine to help prevent HIV infection. If you choose to take medicine to prevent HIV, you should first get tested for HIV. You  should then be tested every 3 months for as long as you are taking the medicine. Pregnancy  If you are about to stop having your period (premenopausal) and you may become pregnant, seek counseling before you get pregnant.  Take 400 to 800 micrograms (mcg) of folic acid every day if you become pregnant.  Ask for birth control (contraception) if you want to prevent pregnancy. Osteoporosis and menopause Osteoporosis is a disease in which the bones lose minerals and strength with aging. This can result in bone fractures. If you are 37 years old or older, or if you are at risk for osteoporosis and fractures, ask your health care provider if you should:  Be screened for bone loss.  Take a calcium or vitamin D supplement to lower your risk of fractures.  Be given hormone replacement therapy (HRT) to treat symptoms of menopause. Follow these instructions at home: Lifestyle  Do not use any products that contain nicotine or tobacco, such as cigarettes, e-cigarettes, and chewing tobacco.  If you need help quitting, ask your health care provider.  Do not use street drugs.  Do not share needles.  Ask your health care provider for help if you need support or information about quitting drugs. Alcohol use  Do not drink alcohol if: ? Your health care provider tells you not to drink. ? You are pregnant, may be pregnant, or are planning to become pregnant.  If you drink alcohol: ? Limit how much you use to 0-1 drink a day. ? Limit intake if you are breastfeeding.  Be aware of how much alcohol is in your drink. In the [place] one drink equals one 12 oz bottle of beer (355 mL), one 5 oz glass of wine (148 mL), or one 1 oz glass of hard liquor (44 mL). General instructions  Schedule regular health, dental, and eye exams.  Stay current with your vaccines.  Tell your health care provider if: ? You often feel depressed. ? You have ever been abused or do not feel safe at home. Summary  Adopting a healthy lifestyle and getting preventive care are important in promoting health and wellness.  Follow your health care provider's instructions about healthy diet, exercising, and getting tested or screened for diseases.  Follow your health care provider's instructions on monitoring your cholesterol and blood pressure. This information is not intended to replace advice given to you by your health care provider. Make sure you discuss any questions you have with your health care provider. Document Revised: 06/19/2018 Document Reviewed: 06/19/2018 Elsevier Patient Education  2020 [employer].

## 2019-08-07 ENCOUNTER — Other Ambulatory Visit: Payer: Self-pay

## 2019-08-07 ENCOUNTER — Other Ambulatory Visit: Payer: Self-pay | Admitting: Women's Health

## 2019-08-07 ENCOUNTER — Ambulatory Visit (INDEPENDENT_AMBULATORY_CARE_PROVIDER_SITE_OTHER): Payer: BC Managed Care – PPO

## 2019-08-07 DIAGNOSIS — N852 Hypertrophy of uterus: Secondary | ICD-10-CM

## 2019-08-07 DIAGNOSIS — D251 Intramural leiomyoma of uterus: Secondary | ICD-10-CM | POA: Diagnosis not present

## 2019-08-07 DIAGNOSIS — R102 Pelvic and perineal pain: Secondary | ICD-10-CM

## 2019-08-07 LAB — URINALYSIS, COMPLETE W/RFL CULTURE
Bilirubin Urine: NEGATIVE
Glucose, UA: NEGATIVE
Hyaline Cast: NONE SEEN /LPF
Ketones, ur: NEGATIVE
Nitrites, Initial: NEGATIVE
Protein, ur: NEGATIVE
Specific Gravity, Urine: 1.025 (ref 1.001–1.03)
pH: 6.5 (ref 5.0–8.0)

## 2019-08-07 LAB — URINE CULTURE
MICRO NUMBER:: 10076416
SPECIMEN QUALITY:: ADEQUATE

## 2019-08-07 LAB — CULTURE INDICATED

## 2019-08-07 MED ORDER — SULFAMETHOXAZOLE-TRIMETHOPRIM 800-160 MG PO TABS: 1.0000 | ORAL_TABLET | Freq: Two times a day (BID) | ORAL | 0 refills | Status: DC

## 2019-08-11 ENCOUNTER — Other Ambulatory Visit: Payer: Self-pay

## 2019-08-11 ENCOUNTER — Encounter: Payer: Self-pay | Admitting: Women's Health

## 2019-08-11 ENCOUNTER — Ambulatory Visit (INDEPENDENT_AMBULATORY_CARE_PROVIDER_SITE_OTHER): Payer: BC Managed Care – PPO | Admitting: Women's Health

## 2019-08-11 DIAGNOSIS — Z30011 Encounter for initial prescription of contraceptive pills: Secondary | ICD-10-CM | POA: Diagnosis not present

## 2019-08-11 DIAGNOSIS — D252 Subserosal leiomyoma of uterus: Secondary | ICD-10-CM | POA: Diagnosis not present

## 2019-08-11 DIAGNOSIS — D259 Leiomyoma of uterus, unspecified: Secondary | ICD-10-CM | POA: Insufficient documentation

## 2019-08-11 DIAGNOSIS — D251 Intramural leiomyoma of uterus: Secondary | ICD-10-CM

## 2019-08-11 MED ORDER — NORETHIN ACE-ETH ESTRAD-FE 1-20 MG-MCG PO TABS: 1.0000 | ORAL_TABLET | Freq: Every day | ORAL | 4 refills | Status: DC

## 2019-08-11 NOTE — Progress Notes (Signed)
Telephone visit, patient identified by name, birthdate, she is at work, I am at the office.  Purpose of televisit is to review ultrasound that was done here at our office on 08/07/2019.  Agreeable with televisit review of ultrasound. At annual exam on 08/04/2019 uterus enlarged, patient reported increased pelvic pressure, increased urinary frequency without UTI symptoms and cycles becoming heavier. 08/07/2019 ultrasound done.  Ultrasound report reviewed the numerous intramural and subserous fibroids, normal ovaries with patient.  Ultrasound: Both abdominal and vaginal ultrasound necessary to evaluate pelvic anatomy.  Grossly enlarged uterus 16 x 10 x 9 cm with numerous intramural and subserous fibroids, 12 fibroids counted,  5.11 cm,  5.94 cm,  4.17 cm,  3.22 cm,  1.64 cm, 1.67 cm, 1.87 cm, 1.61 cm, 1.94 cm, 2.15 cm, 3.56 cm, 1.98 cm,.  Endometrium is distorted by central fibroids no obvious thickening, masses or abnormal blood flow.  Uterus tender to palpation.  Both ovaries normal size with normal follicular pattern.  Bilateral cystic areas resembling resolving corpus luteal cysts right ovary 12.12 mm, left ovary 12.11 mm.  No adnexal masses.  Small amount of free fluid in the cul-de-sac.  Multiple fibroid uterus causing pelvic pressure and menorrhagia  Plan: Options reviewed, reviewed most likely fibroids are benign but causing the pressure sensation.  Has used OCs in the past without problem, Loestrin 1/20 prescription, proper use, slight risk for blood clots and strokes reviewed will start first day of next cycle.  Reviewed ablation, IUDs most likely would not be as effective for relief of pressure and cycle management.  Option of hysterectomy, will schedule appointment with Dr. Dellis Filbert to discuss.   Length of telephone visit 17 minutes

## 2019-08-13 ENCOUNTER — Telehealth: Payer: Self-pay | Admitting: *Deleted

## 2019-08-13 NOTE — Telephone Encounter (Signed)
Patient informed. 

## 2019-08-13 NOTE — Telephone Encounter (Signed)
Try Motrin 600 every 8 hours, schedule appointment with Dr. Dellis Filbert to discuss possible hysterectomy, review with Maudie Mercury that I did speak with Dr. Dellis Filbert about her multiple fibroid uterus.

## 2019-08-13 NOTE — Telephone Encounter (Signed)
Patient had tele-visit via phone on 08/11/19 to discuss ultrasound. Called today c/o back pain that started last night, still has pressure/cramping, feels nauseated off/on. She is not bleeding, just feels bad, due to the fibroids, taking OTC Motrin. Patient would like recommendations. Please advise

## 2019-08-27 ENCOUNTER — Encounter: Payer: Self-pay | Admitting: Obstetrics & Gynecology

## 2019-08-27 ENCOUNTER — Ambulatory Visit: Payer: BC Managed Care – PPO | Admitting: Obstetrics & Gynecology

## 2019-08-27 ENCOUNTER — Other Ambulatory Visit: Payer: Self-pay

## 2019-08-27 VITALS — BP 120/82

## 2019-08-27 DIAGNOSIS — R102 Pelvic and perineal pain: Secondary | ICD-10-CM | POA: Diagnosis not present

## 2019-08-27 DIAGNOSIS — D259 Leiomyoma of uterus, unspecified: Secondary | ICD-10-CM

## 2019-08-27 DIAGNOSIS — N920 Excessive and frequent menstruation with regular cycle: Secondary | ICD-10-CM

## 2019-08-27 DIAGNOSIS — D252 Subserosal leiomyoma of uterus: Secondary | ICD-10-CM

## 2019-08-27 DIAGNOSIS — N852 Hypertrophy of uterus: Secondary | ICD-10-CM

## 2019-08-27 DIAGNOSIS — D251 Intramural leiomyoma of uterus: Secondary | ICD-10-CM

## 2019-08-27 NOTE — Progress Notes (Signed)
Theresa Noble [facility] January 27, 1976 [id number]   History:    44 y.o. G1P1L1 Daughter by C/S.  RP:  Established patient presenting for symptomatic uterine fibroids  HPI: Polymenorrhagia since October 2020.  Also experiencing abdominal pelvic pressure and pain. Which is worsening since December 2020. The discomfort and pain are slightly improved with ibuprofen.  Patient just started on the generic of Loestrin FE 1/20 in January 2021, well-tolerated so far.  Pelvic ultrasound in January 2021 showed a very enlarged uterus with multiple fibroids.  Past medical history,surgical history, family history and social history were all reviewed and documented in the EPIC chart.  Gynecologic History Patient's last menstrual period was 07/30/2019.  Obstetric History OB History  Gravida Para Term Preterm AB Living  1 1     0 1  SAB TAB Ectopic Multiple Live Births      0        # Outcome Date GA Lbr Len/2nd Weight Sex Delivery Anes PTL Lv  1 Para              ROS: A ROS was performed and pertinent positives and negatives are included in the history.  GENERAL: No fevers or chills. HEENT: No change in vision, no earache, sore throat or sinus congestion. NECK: No pain or stiffness. CARDIOVASCULAR: No chest pain or pressure. No palpitations. PULMONARY: No shortness of breath, cough or wheeze. GASTROINTESTINAL: No abdominal pain, nausea, vomiting or diarrhea, melena or bright red blood per rectum. GENITOURINARY: No urinary frequency, urgency, hesitancy or dysuria. MUSCULOSKELETAL: No joint or muscle pain, no back pain, no recent trauma. DERMATOLOGIC: No rash, no itching, no lesions. ENDOCRINE: No polyuria, polydipsia, no heat or cold intolerance. No recent change in weight. HEMATOLOGICAL: No anemia or easy bruising or bleeding. NEUROLOGIC: No headache, seizures, numbness, tingling or weakness. PSYCHIATRIC: No depression, no loss of interest in normal activity or change in sleep pattern.     Exam:   BP  120/82 (BP Location: Right Arm, Patient Position: Sitting, Cuff Size: Normal)   LMP 07/30/2019   There is no height or weight on file to calculate BMI.  General appearance : Well developed well nourished female. No acute distress  Abdomen: Nodular uterus with UH at 2 fingers above the umbilicus  Pelvic: Vulva: Normal             Vagina: No gross lesions or discharge  Cervix: No gross lesions or discharge  Uterus  Large nodular uterus  Adnexa  Without masses or tenderness  Anus: Normal  Pelvic US 08/07/2019: Both abdominal and vaginal ultrasound necessary to evaluate pelvic anatomy.  Grossly enlarged uterus 16 x 10 x 9 cm with numerous intramural and subserous fibroids, 12 counted, fibroid  5.11 cm,  5.94 cm,  4.17 cm,  3.22 cm,  1.64 cm, 1.67 cm, 1.87 cm, 1.61 cm, 1.94 cm, 2.15 cm, 3.56 cm, 1.98 cm,.  Endometrium is distorted by central fibroids no obvious thickening, masses or abnormal blood flow.  Uterus tender to palpation.  Both ovaries normal size with normal follicular pattern.  Bilateral cystic areas resembling resolving corpus luteal cysts right ovary 12.12 mm, left ovary 12.11 mm.  No adnexal masses.  Small amount of free fluid in the cul-de-sac.   Assessment/Plan:  44 y.o. female for annual exam   1. Enlarged uterus Very enlarged uterus measured at 2 fingerbreadths above the umbilicus on exam today and grossly enlarged on pelvic ultrasound measured at 16 x 10 x 9 cm with numerous intramural and  subserosal fibroids, at least 12.  The endometrium was distorted by the central fibroids but no thickening seen.  Both ovaries were normal.  Given the very enlarged uterus with numerous fibroids which are causing severe symptoms both in terms of pain and discomfort, as well as heavy and frequent menses, patient prefers to proceed with hysterectomy.  Full counseling on all possible management option done including hormonal treatment and Depo-Lupron, surgical approaches reviewed, given the very  large uterine size as well as the previous history of a C-section, decision to proceed with a total abdominal hysterectomy with bilateral salpingectomy.  Patient is 44 year old with normal ovaries on pelvic ultrasound, will therefore preserve her ovaries.  Benefits of preserving the ovaries reviewed thoroughly with patient.  Patient voiced understanding and agreement with plan.  We will follow-up for a preop visit either in person or as a televisit.  2. Intramural and subserous leiomyoma of uterus As above.  3. Menorrhagia with regular cycle Polymenorrhea since October 2020.  Endometrial lining distorted by fibroids but not thickened.  4. Pelvic pressure in female Probably due to the very large fibromatous uterus.   Princess Bruins MD, 4:25 PM 08/27/2019

## 2019-08-28 ENCOUNTER — Encounter: Payer: Self-pay | Admitting: Obstetrics & Gynecology

## 2019-08-28 NOTE — Patient Instructions (Signed)
1. Enlarged uterus Very enlarged uterus measured at 2 fingerbreadths above the umbilicus on exam today and grossly enlarged on pelvic ultrasound measured at 16 x 10 x 9 cm with numerous intramural and subserosal fibroids, at least 12.  The endometrium was distorted by the central fibroids but no thickening seen.  Both ovaries were normal.  Given the very enlarged uterus with numerous fibroids which are causing severe symptoms both in terms of pain and discomfort, as well as heavy and frequent menses, patient prefers to proceed with hysterectomy.  Full counseling on all possible management option done including hormonal treatment and Depo-Lupron, surgical approaches reviewed, given the very large uterine size as well as the previous history of a C-section, decision to proceed with a total abdominal hysterectomy with bilateral salpingectomy.  Patient is 44 year old with normal ovaries on pelvic ultrasound, will therefore preserve her ovaries.  Benefits of preserving the ovaries reviewed thoroughly with patient.  Patient voiced understanding and agreement with plan.  We will follow-up for a preop visit either in person or as a televisit.  2. Intramural and subserous leiomyoma of uterus As above.  3. Menorrhagia with regular cycle Polymenorrhea since October 2020.  Endometrial lining distorted by fibroids but not thickened.  4. Pelvic pressure in female Probably due to the very large fibromatous uterus.  Latrisa, it was a pleasure seeing you today!

## 2019-09-03 ENCOUNTER — Other Ambulatory Visit: Payer: Self-pay

## 2019-09-03 ENCOUNTER — Ambulatory Visit
Admission: RE | Admit: 2019-09-03 | Discharge: 2019-09-03 | Disposition: A | Discharge status: Home / Self Care | Payer: BC Managed Care – PPO | Source: Ambulatory Visit | Attending: Women's Health | Admitting: Women's Health

## 2019-09-03 DIAGNOSIS — Z1231 Encounter for screening mammogram for malignant neoplasm of breast: Secondary | ICD-10-CM

## 2019-09-05 NOTE — Patient Instructions (Addendum)
DUE TO COVID-19 ONLY ONE VISITOR IS ALLOWED IN WAITING ROOM (VISITOR WILL HAVE A TEMPERATURE CHECK ON ARRIVAL AND MUST WEAR A FACE MASK THE ENTIRE TIME.)  ONCE YOU ARE ADMITTED TO YOUR PRIVATE ROOM, THE SAME ONE VISITOR IS ALLOWED TO VISIT DURING VISITING HOURS ONLY.  Your COVID swab testing is scheduled for: 09/11/19  at  3:00 pm, You must self quarantine after your testing per handout given to you at the testing site.  ([facility] testing enter pre-surgical testing line)    Your procedure is scheduled on: 09/15/2019  Report to [facility] AT: 8:00  A. M.   Call this number if you have problems the morning of surgery: [phone].   OUR ADDRESS IS [place].  WE ARE LOCATED IN THE [place]                                   [facility].                                     REMEMBER:   DO NOT EAT SOLID FOOD AFTER MIDNIGHT . CLEAR LIQUID DIET FROM MIDNIGHT UNTIL: 7:00 AM    CLEAR LIQUID DIET   Foods Allowed                                                                     Foods Excluded  Coffee and tea, regular and decaf                             liquids that you cannot  Plain Jell-O any favor except red or purple                                           see through such as: Fruit ices (not with fruit pulp)                                     milk, soups, orange juice  Iced Popsicles                                    All solid food Carbonated beverages, regular and diet                                    Cranberry, grape and apple juices Sports drinks like Gatorade Lightly seasoned clear broth or consume(fat free) Sugar, honey syrup  Sample Menu Breakfast                                Lunch  Supper Cranberry juice                    Beef broth                            Chicken broth Jell-O                                     Grape juice                            Apple juice Coffee or tea                        Jell-O                                      Popsicle                                                Coffee or tea                        Coffee or tea  _____________________________________________________________________  BRUSH YOUR TEETH THE MORNING OF SURGERY.  TAKE THESE MEDICATIONS MORNING OF SURGERY WITH A SIP OF WATER:  N/A  DO NOT WEAR JEWERLY, MAKE UP, OR NAIL POLISH.  DO NOT WEAR LOTIONS, POWDERS, PERFUMES/COLOGNE OR DEODORANT.  DO NOT SHAVE FOR 24 HOURS PRIOR TO DAY OF SURGERY.  YOU MAY BRING A SMALL OVERNIGHT BAG  CONTACTS, GLASSES, OR DENTURES MAY NOT BE WORN TO SURGERY.                                    [facility] IS NOT RESPONSIBLE  FOR ANY BELONGINGS.          BRING ALL PRESCRIPTION MEDICATIONS WITH YOU THE DAY OF SURGERY IN ORIGINAL CONTAINERS                                                              .             [facility] - Preparing for Surgery Before surgery, you can play an important role.  Because skin is not sterile, your skin needs to be as free of germs as possible.  You can reduce the number of germs on your skin by washing with CHG (chlorahexidine gluconate) soap before surgery.  CHG is an antiseptic cleaner which kills germs and bonds with the skin to continue killing germs even after washing. Please DO NOT use if you have an allergy to CHG or antibacterial soaps.  If your skin becomes reddened/irritated stop using the CHG and inform your nurse when you arrive at Short Stay. Do not shave (including legs and underarms) for at least 48 hours prior to the first CHG shower.  You may shave your face/neck. Please follow these instructions carefully:  1.  Shower with CHG Soap the night before surgery and the  morning of Surgery.  2.  If you choose to wash your hair, wash your hair first as usual with your  normal  shampoo.  3.  After you shampoo, rinse your hair and body thoroughly to remove the   shampoo.                           4.  Use CHG as you would any other liquid soap.  You can apply chg directly  to the skin and wash                       Gently with a scrungie or clean washcloth.  5.  Apply the CHG Soap to your body ONLY FROM THE NECK DOWN.   Do not use on face/ open                           Wound or open sores. Avoid contact with eyes, ears mouth and genitals (private parts).                       Wash face,  Genitals (private parts) with your normal soap.             6.  Wash thoroughly, paying special attention to the area where your surgery  will be performed.  7.  Thoroughly rinse your body with warm water from the neck down.  8.  DO NOT shower/wash with your normal soap after using and rinsing off  the CHG Soap.                9.  Pat yourself dry with a clean towel.            10.  Wear clean pajamas.            11.  Place clean sheets on your bed the night of your first shower and do not  sleep with pets. Day of Surgery : Do not apply any lotions/deodorants the morning of surgery.  Please wear clean clothes to the hospital/surgery center.  FAILURE TO FOLLOW THESE INSTRUCTIONS MAY RESULT IN THE CANCELLATION OF YOUR SURGERY PATIENT SIGNATURE_________________________________  NURSE SIGNATURE__________________________________  ________________________________________________________________________   Adam Phenix  An incentive spirometer is a tool that can help keep your lungs clear and active. This tool measures how well you are filling your lungs with each breath. Taking long deep breaths may help reverse or decrease the chance of developing breathing (pulmonary) problems (especially infection) following:  A long period of time when you are unable to move or be active. BEFORE THE PROCEDURE   If the spirometer includes an indicator to show your best effort, your nurse or respiratory therapist will set it to a desired goal.  If possible, sit up straight  or lean slightly forward. Try not to slouch.  Hold the incentive spirometer in an upright position. INSTRUCTIONS FOR USE  1. Sit on the edge of your bed if possible, or sit up as far as you can in bed or on a chair. 2. Hold the incentive spirometer in an upright position. 3. Breathe out normally. 4. Place the mouthpiece in your mouth and seal your lips tightly around it. 5. Breathe in slowly and as deeply as possible,  raising the piston or the ball toward the top of the column. 6. Hold your breath for 3-5 seconds or for as long as possible. Allow the piston or ball to fall to the bottom of the column. 7. Remove the mouthpiece from your mouth and breathe out normally. 8. Rest for a few seconds and repeat Steps 1 through 7 at least 10 times every 1-2 hours when you are awake. Take your time and take a few normal breaths between deep breaths. 9. The spirometer may include an indicator to show your best effort. Use the indicator as a goal to work toward during each repetition. 10. After each set of 10 deep breaths, practice coughing to be sure your lungs are clear. If you have an incision (the cut made at the time of surgery), support your incision when coughing by placing a pillow or rolled up towels firmly against it. Once you are able to get out of bed, walk around indoors and cough well. You may stop using the incentive spirometer when instructed by your caregiver.  RISKS AND COMPLICATIONS  Take your time so you do not get dizzy or light-headed.  If you are in pain, you may need to take or ask for pain medication before doing incentive spirometry. It is harder to take a deep breath if you are having pain. AFTER USE  Rest and breathe slowly and easily.  It can be helpful to keep track of a log of your progress. Your caregiver can provide you with a simple table to help with this. If you are using the spirometer at home, follow these instructions: [place] IF:   You are having  difficultly using the spirometer.  You have trouble using the spirometer as often as instructed.  Your pain medication is not giving enough relief while using the spirometer.  You develop fever of 100.5 F (38.1 C) or higher. SEEK IMMEDIATE MEDICAL CARE IF:   You cough up bloody sputum that had not been present before.  You develop fever of 102 F (38.9 C) or greater.  You develop worsening pain at or near the incision site. MAKE SURE YOU:   Understand these instructions.  Will watch your condition.  Will get help right away if you are not doing well or get worse. Document Released: 11/06/2006 Document Revised: 09/18/2011 Document Reviewed: 01/07/2007 [facility] Patient Information 2014 [place], [place].   ________________________________________________________________________

## 2019-09-05 NOTE — Progress Notes (Addendum)
CAN YOU PLEASE PLACE ORDERS FOR THE UPCOMING SURGERY.PT. HAS PST. APPOINTMENT ON 09/08/19. [place]

## 2019-09-08 ENCOUNTER — Encounter (HOSPITAL_COMMUNITY)
Admission: RE | Admit: 2019-09-08 | Discharge: 2019-09-08 | Disposition: A | Discharge status: Home / Self Care | Payer: BC Managed Care – PPO | Source: Ambulatory Visit | Attending: Obstetrics & Gynecology | Admitting: Obstetrics & Gynecology

## 2019-09-08 ENCOUNTER — Other Ambulatory Visit: Payer: Self-pay

## 2019-09-08 ENCOUNTER — Encounter (HOSPITAL_COMMUNITY): Payer: Self-pay

## 2019-09-08 ENCOUNTER — Telehealth: Payer: Self-pay

## 2019-09-08 DIAGNOSIS — Z01818 Encounter for other preprocedural examination: Secondary | ICD-10-CM | POA: Diagnosis not present

## 2019-09-08 LAB — CBC
HCT: 39.8 % (ref 36.0–46.0)
Hemoglobin: 13 g/dL (ref 12.0–15.0)
MCH: 29.7 pg (ref 26.0–34.0)
MCHC: 32.7 g/dL (ref 30.0–36.0)
MCV: 91.1 fL (ref 80.0–100.0)
Platelets: 430 10*3/uL — ABNORMAL HIGH (ref 150–400)
RBC: 4.37 MIL/uL (ref 3.87–5.11)
RDW: 13.1 % (ref 11.5–15.5)
WBC: 6.5 10*3/uL (ref 4.0–10.5)
nRBC: 0 % (ref 0.0–0.2)

## 2019-09-08 LAB — ABO/RH: ABO/RH(D): B POS

## 2019-09-08 LAB — BASIC METABOLIC PANEL
Anion gap: 8 (ref 5–15)
BUN: 12 mg/dL (ref 6–20)
CO2: 24 mmol/L (ref 22–32)
Calcium: 8.9 mg/dL (ref 8.9–10.3)
Chloride: 105 mmol/L (ref 98–111)
Creatinine, Ser: 0.73 mg/dL (ref 0.44–1.00)
GFR calc Af Amer: >60 mL/min (ref >60)
GFR calc non Af Amer: >60 mL/min (ref >60)
Glucose, Bld: 73 mg/dL (ref 70–99)
Potassium: 3.8 mmol/L (ref 3.5–5.1)
Sodium: 137 mmol/L (ref 135–145)

## 2019-09-08 NOTE — Progress Notes (Signed)
PCP - Dr. Belva Bertin. LOV: 01/17/18 Cardiologist -   Chest x-ray -  EKG -  Stress Test -  ECHO -  Cardiac Cath -   Sleep Study -  CPAP -   Fasting Blood Sugar -  Checks Blood Sugar _____ times a day  Blood Thinner Instructions: Aspirin Instructions: Last Dose:  Anesthesia review:   Patient denies shortness of breath, fever, cough and chest pain at PAT appointment   Patient verbalized understanding of instructions that were given to them at the PAT appointment. Patient was also instructed that they will need to review over the PAT instructions again at home before surgery.

## 2019-09-08 NOTE — Telephone Encounter (Signed)
I called patient and discussed with her that case scheduled at 7:30am before her scheduled case has cancelled and I need to move her into that first earlier spot. She was very understanding and agreed. Case moved to 7:30am.

## 2019-09-09 ENCOUNTER — Telehealth (INDEPENDENT_AMBULATORY_CARE_PROVIDER_SITE_OTHER): Payer: BC Managed Care – PPO | Admitting: Obstetrics & Gynecology

## 2019-09-09 ENCOUNTER — Other Ambulatory Visit: Payer: Self-pay

## 2019-09-09 ENCOUNTER — Encounter: Payer: Self-pay | Admitting: Anesthesiology

## 2019-09-09 ENCOUNTER — Encounter: Payer: Self-pay | Admitting: Obstetrics & Gynecology

## 2019-09-09 DIAGNOSIS — D252 Subserosal leiomyoma of uterus: Secondary | ICD-10-CM

## 2019-09-09 DIAGNOSIS — R102 Pelvic and perineal pain: Secondary | ICD-10-CM

## 2019-09-09 DIAGNOSIS — D251 Intramural leiomyoma of uterus: Secondary | ICD-10-CM | POA: Diagnosis not present

## 2019-09-09 DIAGNOSIS — N920 Excessive and frequent menstruation with regular cycle: Secondary | ICD-10-CM

## 2019-09-09 NOTE — Progress Notes (Signed)
    Theresa Noble [facility] 02-29-1976 [id number]        44 y.o.  G1P0001.  Verbal consent obtained for televisit.  Patient well identified.  Televisit conducted between the patient and her home and myself at my [place] office.  Counseling for 25 minutes.  RP: Preop TAH/Bilateral Salpingectomy for large symptomatic Fibroids  HPI: No change since visit on 08/27/2019:  Polymenorrhagia since October 2020.  Also experiencing abdominal pelvic pressure and pain. Which is worsening since December 2020. The discomfort and pain are slightly improved with ibuprofen.  Patient just started on the generic of Loestrin FE 1/20 in January 2021, well-tolerated so far.  Pelvic ultrasound in January 2021 showed a very enlarged uterus with multiple fibroids.   OB History  Gravida Para Term Preterm AB Living  1 1     0 1  SAB TAB Ectopic Multiple Live Births      0        # Outcome Date GA Lbr Len/2nd Weight Sex Delivery Anes PTL Lv  1 Para             Past medical history,surgical history, problem list, medications, allergies, family history and social history were all reviewed and documented in the EPIC chart.   Directed ROS with pertinent positives and negatives documented in the history of present illness/assessment and plan.  Exam:  There were no vitals filed for this visit. General appearance:  Normal  Televisit  Pelvic US 08/07/2019: Both abdominal and vaginal ultrasound necessary to evaluate pelvic anatomy.  Grossly enlarged uterus 16 x 10 x 9 cm with numerous intramural and subserous fibroids, 12 fibroids counted,  5.11 cm,  5.94 cm,  4.17 cm,  3.22 cm,  1.64 cm, 1.67 cm, 1.87 cm, 1.61 cm, 1.94 cm, 2.15 cm, 3.56 cm, 1.98 cm,.  Endometrium is distorted by central fibroids no obvious thickening, masses or abnormal blood flow.  Uterus tender to palpation.  Both ovaries normal size with normal follicular pattern.  Bilateral cystic areas resembling resolving corpus luteal cysts right ovary 12.12 mm, left  ovary 12.11 mm.  No adnexal masses.  Small amount of free fluid in the cul-de-sac.   Assessment/Plan:  44 y.o. G1P0001   1. Intramural and subserous leiomyoma of uterus Multiple large uterine fibroids causing pelvic pressure and menorrhagia.  Decision to proceed with hysterectomy, a TAH/bilateral salpingectomy is scheduled for September 15, 2019.  Preop preparation, procedure including risks as well as postop precautions reviewed thoroughly with patient.  Patient's questions answered.  Will continue on low dose BCPs until surgery to reduce heavy bleeding.  Patient voiced understanding and agreement with plan.  2. Menorrhagia with regular cycle As above.  3. Pelvic pressure in female As above.                        Patient was counseled as to the risk of surgery to include the following:  1. Infection (prohylactic antibiotics will be administered)  2. DVT/Pulmonary Embolism (prophylactic pneumo compression stockings will be used)  3.Trauma to internal organs requiring additional surgical procedure to repair any injury to internal organs requiring perhaps additional hospitalization days.  4.Hemmorhage requiring transfusion and blood products which carry risks such as anaphylactic reaction, hepatitis and AIDS  Patient had received literature information on the procedure scheduled and all her questions were answered and fully accepts all risk.    Princess Bruins MD, 3:30 PM 09/09/2019

## 2019-09-11 ENCOUNTER — Other Ambulatory Visit (HOSPITAL_COMMUNITY)
Admission: RE | Admit: 2019-09-11 | Discharge: 2019-09-11 | Disposition: A | Discharge status: Home / Self Care | Payer: BC Managed Care – PPO | Source: Ambulatory Visit | Attending: Obstetrics & Gynecology | Admitting: Obstetrics & Gynecology

## 2019-09-11 DIAGNOSIS — Z20822 Contact with and (suspected) exposure to covid-19: Secondary | ICD-10-CM | POA: Diagnosis not present

## 2019-09-11 DIAGNOSIS — Z01812 Encounter for preprocedural laboratory examination: Secondary | ICD-10-CM | POA: Insufficient documentation

## 2019-09-12 LAB — SARS CORONAVIRUS 2 (TAT 6-24 HRS): SARS Coronavirus 2: NEGATIVE

## 2019-09-14 NOTE — Anesthesia Preprocedure Evaluation (Addendum)
Anesthesia Evaluation  Patient identified by MRN, date of birth, ID band Patient awake    Reviewed: Allergy & Precautions, H&P , NPO status , Patient's Chart, lab work & pertinent test results  Airway Mallampati: II  TM Distance: >3 FB Neck ROM: Full    Dental no notable dental hx. (+) Teeth Intact, Dental Advisory Given, Caps   Pulmonary neg pulmonary ROS,    Pulmonary exam normal breath sounds clear to auscultation       Cardiovascular Exercise Tolerance: Good negative cardio ROS Normal cardiovascular exam Rhythm:Regular Rate:Normal     Neuro/Psych negative neurological ROS  negative psych ROS   GI/Hepatic negative GI ROS, Neg liver ROS,   Endo/Other  negative endocrine ROS  Renal/GU negative Renal ROS  negative genitourinary   Musculoskeletal negative musculoskeletal ROS (+)   Abdominal   Peds negative pediatric ROS (+)  Hematology negative hematology ROS (+) Blood dyscrasia, anemia ,   Anesthesia Other Findings   Reproductive/Obstetrics negative OB ROS                            Anesthesia Physical Anesthesia Plan  ASA: II  Anesthesia Plan: General   Post-op Pain Management:    Induction: Intravenous  PONV Risk Score and Plan: 3 and Ondansetron, Dexamethasone and Midazolam  Airway Management Planned: Oral ETT  Additional Equipment:   Intra-op Plan:   Post-operative Plan: Extubation in OR  Informed Consent: I have reviewed the patients History and Physical, chart, labs and discussed the procedure including the risks, benefits and alternatives for the proposed anesthesia with the patient or authorized representative who has indicated his/her understanding and acceptance.       Plan Discussed with: Anesthesiologist and CRNA  Anesthesia Plan Comments: (  )        Anesthesia Quick Evaluation

## 2019-09-15 ENCOUNTER — Encounter (HOSPITAL_BASED_OUTPATIENT_CLINIC_OR_DEPARTMENT_OTHER)
Admission: RE | Disposition: A | Discharge status: Home / Self Care | Payer: Self-pay | Source: Home / Self Care | Attending: Obstetrics & Gynecology

## 2019-09-15 ENCOUNTER — Other Ambulatory Visit: Payer: Self-pay

## 2019-09-15 ENCOUNTER — Ambulatory Visit (HOSPITAL_BASED_OUTPATIENT_CLINIC_OR_DEPARTMENT_OTHER): Payer: BC Managed Care – PPO | Admitting: Anesthesiology

## 2019-09-15 ENCOUNTER — Ambulatory Visit (HOSPITAL_BASED_OUTPATIENT_CLINIC_OR_DEPARTMENT_OTHER)
Admission: RE | Admit: 2019-09-15 | Discharge: 2019-09-16 | Disposition: A | Discharge status: Home / Self Care | Payer: BC Managed Care – PPO | Attending: Obstetrics & Gynecology | Admitting: Obstetrics & Gynecology

## 2019-09-15 ENCOUNTER — Encounter (HOSPITAL_BASED_OUTPATIENT_CLINIC_OR_DEPARTMENT_OTHER): Payer: Self-pay | Admitting: Obstetrics & Gynecology

## 2019-09-15 ENCOUNTER — Ambulatory Visit (HOSPITAL_BASED_OUTPATIENT_CLINIC_OR_DEPARTMENT_OTHER): Payer: BC Managed Care – PPO | Admitting: Physician Assistant

## 2019-09-15 DIAGNOSIS — N72 Inflammatory disease of cervix uteri: Secondary | ICD-10-CM | POA: Diagnosis not present

## 2019-09-15 DIAGNOSIS — D649 Anemia, unspecified: Secondary | ICD-10-CM | POA: Insufficient documentation

## 2019-09-15 DIAGNOSIS — N800 Endometriosis of uterus: Secondary | ICD-10-CM | POA: Insufficient documentation

## 2019-09-15 DIAGNOSIS — Z79899 Other long term (current) drug therapy: Secondary | ICD-10-CM | POA: Insufficient documentation

## 2019-09-15 DIAGNOSIS — Z9889 Other specified postprocedural states: Secondary | ICD-10-CM

## 2019-09-15 DIAGNOSIS — N736 Female pelvic peritoneal adhesions (postinfective): Secondary | ICD-10-CM | POA: Diagnosis not present

## 2019-09-15 DIAGNOSIS — N920 Excessive and frequent menstruation with regular cycle: Secondary | ICD-10-CM | POA: Diagnosis not present

## 2019-09-15 DIAGNOSIS — D252 Subserosal leiomyoma of uterus: Secondary | ICD-10-CM | POA: Insufficient documentation

## 2019-09-15 DIAGNOSIS — D251 Intramural leiomyoma of uterus: Secondary | ICD-10-CM | POA: Insufficient documentation

## 2019-09-15 DIAGNOSIS — D259 Leiomyoma of uterus, unspecified: Secondary | ICD-10-CM

## 2019-09-15 DIAGNOSIS — Z793 Long term (current) use of hormonal contraceptives: Secondary | ICD-10-CM | POA: Insufficient documentation

## 2019-09-15 HISTORY — PX: HYSTERECTOMY ABDOMINAL WITH SALPINGECTOMY: SHX6725

## 2019-09-15 LAB — TYPE AND SCREEN
ABO/RH(D): B POS
Antibody Screen: NEGATIVE

## 2019-09-15 LAB — POCT PREGNANCY, URINE: Preg Test, Ur: NEGATIVE

## 2019-09-15 SURGERY — HYSTERECTOMY, TOTAL, ABDOMINAL, WITH SALPINGECTOMY
Anesthesia: General | Site: Abdomen | Laterality: Bilateral

## 2019-09-15 MED ORDER — ONDANSETRON HCL 4 MG/2ML IJ SOLN
4.0000 mg | Freq: Once | INTRAMUSCULAR | Status: DC | PRN
  Filled 2019-09-15: qty 2

## 2019-09-15 MED ORDER — OXYCODONE HCL 5 MG PO TABS
5.0000 mg | ORAL_TABLET | Freq: Once | ORAL | Status: DC | PRN
  Filled 2019-09-15: qty 1

## 2019-09-15 MED ORDER — OXYCODONE HCL 5 MG PO TABS
ORAL_TABLET | ORAL | Status: AC
  Filled 2019-09-15: qty 2

## 2019-09-15 MED ORDER — ACETAMINOPHEN 160 MG/5ML PO SOLN
325.0000 mg | ORAL | Status: DC | PRN
  Filled 2019-09-15: qty 20.3

## 2019-09-15 MED ORDER — FENTANYL CITRATE (PF) 100 MCG/2ML IJ SOLN
INTRAMUSCULAR | Status: AC
  Filled 2019-09-15: qty 2

## 2019-09-15 MED ORDER — SUGAMMADEX SODIUM 200 MG/2ML IV SOLN
INTRAVENOUS | Status: DC | PRN
  Administered 2019-09-15: 200 mg via INTRAVENOUS

## 2019-09-15 MED ORDER — ROCURONIUM BROMIDE 10 MG/ML (PF) SYRINGE
PREFILLED_SYRINGE | INTRAVENOUS | Status: AC
  Filled 2019-09-15: qty 10

## 2019-09-15 MED ORDER — BUPIVACAINE LIPOSOME 1.3 % IJ SUSP
INTRAMUSCULAR | Status: DC | PRN
  Administered 2019-09-15: 20 mL

## 2019-09-15 MED ORDER — SCOPOLAMINE 1 MG/3DAYS TD PT72
MEDICATED_PATCH | TRANSDERMAL | Status: AC
  Filled 2019-09-15: qty 1

## 2019-09-15 MED ORDER — MEPERIDINE HCL 25 MG/ML IJ SOLN
6.2500 mg | INTRAMUSCULAR | Status: DC | PRN
  Filled 2019-09-15: qty 1

## 2019-09-15 MED ORDER — ACETAMINOPHEN 10 MG/ML IV SOLN
INTRAVENOUS | Status: DC | PRN
  Administered 2019-09-15: 1000 mg via INTRAVENOUS

## 2019-09-15 MED ORDER — LIDOCAINE HCL 2 % IJ SOLN
INTRAMUSCULAR | Status: AC
  Filled 2019-09-15: qty 20

## 2019-09-15 MED ORDER — CEFAZOLIN SODIUM-DEXTROSE 2-4 GM/100ML-% IV SOLN
INTRAVENOUS | Status: AC
  Filled 2019-09-15: qty 100

## 2019-09-15 MED ORDER — ACETAMINOPHEN 325 MG PO TABS
650.0000 mg | ORAL_TABLET | ORAL | Status: DC | PRN
  Filled 2019-09-15: qty 2

## 2019-09-15 MED ORDER — FENTANYL CITRATE (PF) 250 MCG/5ML IJ SOLN
INTRAMUSCULAR | Status: AC
  Filled 2019-09-15: qty 5

## 2019-09-15 MED ORDER — BUPIVACAINE HCL (PF) 0.25 % IJ SOLN
INTRAMUSCULAR | Status: DC | PRN
  Administered 2019-09-15: 10 mL

## 2019-09-15 MED ORDER — OXYCODONE HCL 5 MG PO TABS
5.0000 mg | ORAL_TABLET | ORAL | Status: DC | PRN
  Administered 2019-09-15 (×2): 10 mg via ORAL
  Administered 2019-09-15 (×2): 5 mg via ORAL
  Administered 2019-09-16 (×2): 10 mg via ORAL
  Filled 2019-09-15: qty 2

## 2019-09-15 MED ORDER — KETOROLAC TROMETHAMINE 30 MG/ML IJ SOLN
INTRAMUSCULAR | Status: DC | PRN
  Administered 2019-09-15: 30 mg via INTRAVENOUS

## 2019-09-15 MED ORDER — EPHEDRINE SULFATE-NACL 50-0.9 MG/10ML-% IV SOSY
PREFILLED_SYRINGE | INTRAVENOUS | Status: DC | PRN
  Administered 2019-09-15: 10 mg via INTRAVENOUS

## 2019-09-15 MED ORDER — EPHEDRINE 5 MG/ML INJ
INTRAVENOUS | Status: AC
  Filled 2019-09-15: qty 10

## 2019-09-15 MED ORDER — PROPOFOL 10 MG/ML IV BOLUS
INTRAVENOUS | Status: AC
  Filled 2019-09-15: qty 40

## 2019-09-15 MED ORDER — HYDROMORPHONE HCL 1 MG/ML IJ SOLN
0.5000 mg | INTRAMUSCULAR | Status: DC | PRN
  Administered 2019-09-15: 0.5 mg via INTRAVENOUS
  Filled 2019-09-15: qty 0.5

## 2019-09-15 MED ORDER — SODIUM CHLORIDE (PF) 0.9 % IJ SOLN
INTRAMUSCULAR | Status: DC | PRN
  Administered 2019-09-15: 30 mL via INTRAVENOUS

## 2019-09-15 MED ORDER — DEXAMETHASONE SODIUM PHOSPHATE 10 MG/ML IJ SOLN
INTRAMUSCULAR | Status: AC
  Filled 2019-09-15: qty 1

## 2019-09-15 MED ORDER — ROCURONIUM BROMIDE 10 MG/ML (PF) SYRINGE
PREFILLED_SYRINGE | INTRAVENOUS | Status: DC | PRN
  Administered 2019-09-15: 60 mg via INTRAVENOUS
  Administered 2019-09-15: 10 mg via INTRAVENOUS

## 2019-09-15 MED ORDER — KETAMINE HCL 10 MG/ML IJ SOLN
INTRAMUSCULAR | Status: AC
  Filled 2019-09-15: qty 1

## 2019-09-15 MED ORDER — DEXAMETHASONE SODIUM PHOSPHATE 10 MG/ML IJ SOLN
INTRAMUSCULAR | Status: DC | PRN
  Administered 2019-09-15: 8 mg via INTRAVENOUS

## 2019-09-15 MED ORDER — ONDANSETRON HCL 4 MG/2ML IJ SOLN
INTRAMUSCULAR | Status: DC | PRN
  Administered 2019-09-15 (×2): 4 mg via INTRAVENOUS

## 2019-09-15 MED ORDER — HYDROMORPHONE HCL 1 MG/ML IJ SOLN
INTRAMUSCULAR | Status: AC
  Filled 2019-09-15: qty 1

## 2019-09-15 MED ORDER — MIDAZOLAM HCL 2 MG/2ML IJ SOLN
INTRAMUSCULAR | Status: AC
  Filled 2019-09-15: qty 2

## 2019-09-15 MED ORDER — KETAMINE HCL 10 MG/ML IJ SOLN
INTRAMUSCULAR | Status: DC | PRN
  Administered 2019-09-15: 40 mg via INTRAVENOUS

## 2019-09-15 MED ORDER — CEFAZOLIN SODIUM-DEXTROSE 2-4 GM/100ML-% IV SOLN
2.0000 g | INTRAVENOUS | Status: AC
  Administered 2019-09-15: 2 g via INTRAVENOUS
  Filled 2019-09-15: qty 100

## 2019-09-15 MED ORDER — ACETAMINOPHEN 10 MG/ML IV SOLN
INTRAVENOUS | Status: AC
  Filled 2019-09-15: qty 100

## 2019-09-15 MED ORDER — ARTIFICIAL TEARS OPHTHALMIC OINT
TOPICAL_OINTMENT | OPHTHALMIC | Status: AC
  Filled 2019-09-15: qty 3.5

## 2019-09-15 MED ORDER — FENTANYL CITRATE (PF) 100 MCG/2ML IJ SOLN
25.0000 ug | INTRAMUSCULAR | Status: DC | PRN
  Administered 2019-09-15 (×2): 25 ug via INTRAVENOUS
  Administered 2019-09-15: 50 ug via INTRAVENOUS
  Filled 2019-09-15: qty 1

## 2019-09-15 MED ORDER — LIDOCAINE 2% (20 MG/ML) 5 ML SYRINGE
INTRAMUSCULAR | Status: DC | PRN
  Administered 2019-09-15: 80 mg via INTRAVENOUS

## 2019-09-15 MED ORDER — LIDOCAINE 2% (20 MG/ML) 5 ML SYRINGE
INTRAMUSCULAR | Status: AC
  Filled 2019-09-15: qty 5

## 2019-09-15 MED ORDER — PROPOFOL 10 MG/ML IV BOLUS
INTRAVENOUS | Status: DC | PRN
  Administered 2019-09-15: 150 mg via INTRAVENOUS
  Administered 2019-09-15: 20 mg via INTRAVENOUS

## 2019-09-15 MED ORDER — OXYCODONE-ACETAMINOPHEN 5-325 MG PO TABS
1.0000 | ORAL_TABLET | ORAL | Status: DC | PRN
  Filled 2019-09-15: qty 2

## 2019-09-15 MED ORDER — SCOPOLAMINE 1 MG/3DAYS TD PT72
MEDICATED_PATCH | TRANSDERMAL | Status: DC | PRN
  Administered 2019-09-15: 1 via TRANSDERMAL

## 2019-09-15 MED ORDER — KETOROLAC TROMETHAMINE 30 MG/ML IJ SOLN
INTRAMUSCULAR | Status: AC
  Filled 2019-09-15: qty 1

## 2019-09-15 MED ORDER — ONDANSETRON HCL 4 MG/2ML IJ SOLN
INTRAMUSCULAR | Status: AC
  Filled 2019-09-15: qty 2

## 2019-09-15 MED ORDER — LACTATED RINGERS IV SOLN
INTRAVENOUS | Status: DC
  Filled 2019-09-15: qty 1000

## 2019-09-15 MED ORDER — OXYCODONE HCL 5 MG/5ML PO SOLN
5.0000 mg | Freq: Once | ORAL | Status: DC | PRN
  Filled 2019-09-15: qty 5

## 2019-09-15 MED ORDER — ACETAMINOPHEN 325 MG PO TABS
325.0000 mg | ORAL_TABLET | ORAL | Status: DC | PRN
  Filled 2019-09-15: qty 2

## 2019-09-15 MED ORDER — LIDOCAINE 2% (20 MG/ML) 5 ML SYRINGE
INTRAMUSCULAR | Status: DC | PRN
  Administered 2019-09-15: 1.5 mg/kg/h via INTRAVENOUS

## 2019-09-15 MED ORDER — FENTANYL CITRATE (PF) 100 MCG/2ML IJ SOLN
INTRAMUSCULAR | Status: DC | PRN
  Administered 2019-09-15: 100 ug via INTRAVENOUS
  Administered 2019-09-15: 50 ug via INTRAVENOUS

## 2019-09-15 MED ORDER — MIDAZOLAM HCL 2 MG/2ML IJ SOLN
INTRAMUSCULAR | Status: DC | PRN
  Administered 2019-09-15: 2 mg via INTRAVENOUS

## 2019-09-15 MED ORDER — LACTATED RINGERS IV SOLN
INTRAVENOUS | Status: DC
  Administered 2019-09-15: 125 mL/h via INTRAVENOUS
  Filled 2019-09-15 (×2): qty 1000

## 2019-09-15 SURGICAL SUPPLY — 46 items
ADH SKN CLS APL DERMABOND .7 (GAUZE/BANDAGES/DRESSINGS) ×1
CANISTER SUCT 3000ML PPV (MISCELLANEOUS) ×3 IMPLANT
DECANTER SPIKE VIAL GLASS SM (MISCELLANEOUS) ×2 IMPLANT
DERMABOND ADVANCED (GAUZE/BANDAGES/DRESSINGS) ×2
DERMABOND ADVANCED .7 DNX12 (GAUZE/BANDAGES/DRESSINGS) IMPLANT
DRAPE CESAREAN BIRTH W POUCH (DRAPES) ×2 IMPLANT
DRAPE LAPAROTOMY T 102X78X121 (DRAPES) ×3 IMPLANT
DRAPE WARM FLUID 44X44 (DRAPES) ×2 IMPLANT
DRSG OPSITE POSTOP 4X10 (GAUZE/BANDAGES/DRESSINGS) ×1 IMPLANT
DURAPREP 26ML APPLICATOR (WOUND CARE) ×3 IMPLANT
ELECT BLADE TIP CTD 4 INCH (ELECTRODE) ×2 IMPLANT
GAUZE 4X4 16PLY RFD (DISPOSABLE) ×3 IMPLANT
GLOVE BIO SURGEON STRL SZ 6.5 (GLOVE) ×2 IMPLANT
GLOVE BIO SURGEON STRL SZ7.5 (GLOVE) ×3 IMPLANT
GLOVE BIO SURGEONS STRL SZ 6.5 (GLOVE) ×1
GLOVE BIOGEL PI IND STRL 7.0 (GLOVE) ×3 IMPLANT
GLOVE BIOGEL PI INDICATOR 7.0 (GLOVE) ×6
GOWN STRL REUS W/TWL LRG LVL3 (GOWN DISPOSABLE) ×9 IMPLANT
HEMOSTAT ARISTA ABSORB 3G PWDR (HEMOSTASIS) ×2 IMPLANT
HIBICLENS CHG 4% 4OZ (MISCELLANEOUS) ×3 IMPLANT
HOLDER FOLEY CATH W/STRAP (MISCELLANEOUS) ×2 IMPLANT
LIGASURE IMPACT 36 18CM CVD LR (INSTRUMENTS) ×2 IMPLANT
NDL SAFETY ECLIPSE 18X1.5 (NEEDLE) IMPLANT
NEEDLE HYPO 18GX1.5 SHARP (NEEDLE) ×3
NEEDLE HYPO 22GX1.5 SAFETY (NEEDLE) ×6 IMPLANT
NS IRRIG 1000ML POUR BTL (IV SOLUTION) ×2 IMPLANT
PACK ABDOMINAL GYN (CUSTOM PROCEDURE TRAY) ×3 IMPLANT
PAD OB MATERNITY 4.3X12.25 (PERSONAL CARE ITEMS) ×3 IMPLANT
PROTECTOR NERVE ULNAR (MISCELLANEOUS) ×3 IMPLANT
SPONGE LAP 18X18 RF (DISPOSABLE) ×8 IMPLANT
SUT PLAIN 2 0 XLH (SUTURE) ×2 IMPLANT
SUT PLAIN 3 0 CT 1 27 (SUTURE) IMPLANT
SUT PROLENE 3 0 SH DA (SUTURE) IMPLANT
SUT VIC AB 0 CT1 18XCR BRD8 (SUTURE) ×2 IMPLANT
SUT VIC AB 0 CT1 27 (SUTURE) ×15
SUT VIC AB 0 CT1 27XBRD ANBCTR (SUTURE) ×4 IMPLANT
SUT VIC AB 0 CT1 8-18 (SUTURE) ×6
SUT VIC AB 3-0 SH 27 (SUTURE)
SUT VIC AB 3-0 SH 27X BRD (SUTURE) IMPLANT
SUT VIC AB 4-0 KS 27 (SUTURE) ×2 IMPLANT
SUT VICRYL 0 TIES 12 18 (SUTURE) ×3 IMPLANT
SUT VICRYL 4-0 PS2 18IN ABS (SUTURE) IMPLANT
SYR 30ML LL (SYRINGE) ×2 IMPLANT
SYR CONTROL 10ML LL (SYRINGE) ×6 IMPLANT
TOWEL OR 17X26 10 PK STRL BLUE (TOWEL DISPOSABLE) ×3 IMPLANT
TRAY FOLEY W/BAG SLVR 14FR (SET/KITS/TRAYS/PACK) ×3 IMPLANT

## 2019-09-15 NOTE — Transfer of Care (Signed)
    Last Vitals:  Vitals Value Taken Time  BP 123/74 09/15/19 1006  Temp    Pulse 58 09/15/19 1007  Resp 15 09/15/19 1007  SpO2 98 % 09/15/19 1007  Vitals shown include unvalidated device data.  Last Pain:  Vitals:   09/15/19 0640  TempSrc: Oral  PainSc:       Patients Stated Pain Goal: 4 ([id number])  Complications: Immediate Anesthesia Transfer of Care Note  Patient: Theresa Noble [facility]  Procedure(s) Performed: Procedure(s) (LRB): HYSTERECTOMY ABDOMINAL WITH SALPINGECTOMY (Bilateral)  Patient Location: PACU  Anesthesia Type: General  Level of Consciousness: awake, alert  and oriented  Airway & Oxygen Therapy: Patient Spontanous Breathing and Patient connected to nasal cannula oxygen  Post-op Assessment: Report given to PACU RN and Post -op Vital signs reviewed and stable  Post vital signs: Reviewed and stable  Complications: No apparent anesthesia complications

## 2019-09-15 NOTE — H&P (Signed)
Theresa Noble is an 44 y.o. female.  G1P0001.    RP: TAH/Bilateral Salpingectomy for large symptomatic Fibroids  HPI: No change since visit on 09/09/2019: Polymenorrhagia since October 2020. Also experiencing abdominal pelvic pressure and pain. Which is worsening since December 2020. The discomfort and pain are slightly improved with ibuprofen. Patient just started on the generic of Loestrin FE 1/20 in January 2021,well-tolerated so far. Pelvic ultrasound in January 2021 showed a very enlarged uterus with multiple fibroids.  Pertinent Gynecological History: Menses: flow is excessive with use of many pads or tampons on heaviest days Contraception: OCP (estrogen/progesterone) Blood transfusions: none Last mammogram: normal  Last pap: normal     Menstrual History: Patient's last menstrual period was 08/17/2019.    History reviewed. No pertinent past medical history.  Past Surgical History:  Procedure Laterality Date  . CESAREAN SECTION  2005   DUE TO PIH    Family History  Problem Relation Age of Onset  . Breast cancer Mother        DIED IN 2'S  . Cancer Father        LEUKEMIA  . Diabetes Paternal Aunt   . Diabetes Paternal Grandfather     Social History:  reports that she has never smoked. She has never used smokeless tobacco. She reports that she does not drink alcohol or use drugs.  Allergies: No Known Allergies  Medications Prior to Admission  Medication Sig Dispense Refill Last Dose  . Ferrous Sulfate (IRON PO) Take 2 tablets by mouth daily.   09/14/2019 at Unknown time  . Multiple Vitamin (MULTIVITAMIN) tablet Take 1 tablet by mouth daily.     09/14/2019 at Unknown time  . norethindrone-ethinyl estradiol (LOESTRIN FE) 1-20 MG-MCG tablet Take 1 tablet by mouth daily. 3 Package 4 09/14/2019 at Unknown time    REVIEW OF SYSTEMS: A ROS was performed and pertinent positives and negatives are included in the history.  GENERAL: No fevers or chills. HEENT: No  change in vision, no earache, sore throat or sinus congestion. NECK: No pain or stiffness. CARDIOVASCULAR: No chest pain or pressure. No palpitations. PULMONARY: No shortness of breath, cough or wheeze. GASTROINTESTINAL: No abdominal pain, nausea, vomiting or diarrhea, melena or bright red blood per rectum. GENITOURINARY: No urinary frequency, urgency, hesitancy or dysuria. MUSCULOSKELETAL: No joint or muscle pain, no back pain, no recent trauma. DERMATOLOGIC: No rash, no itching, no lesions. ENDOCRINE: No polyuria, polydipsia, no heat or cold intolerance. No recent change in weight. HEMATOLOGICAL: No anemia or easy bruising or bleeding. NEUROLOGIC: No headache, seizures, numbness, tingling or weakness. PSYCHIATRIC: No depression, no loss of interest in normal activity or change in sleep pattern.     Blood pressure 134/87, pulse 65, temperature 97.7 F (36.5 C), temperature source Oral, resp. rate 17, height 5\' 5"  (1.651 m), weight 84.3 kg, last menstrual period 08/17/2019, SpO2 100 %.  Physical Exam:  See office notes   Results for orders placed or performed during the hospital encounter of 09/15/19 (from the past 24 hour(s))  Pregnancy, urine POC     Status: None   Collection Time: 09/15/19  6:04 AM  Result Value Ref Range   Preg Test, Ur NEGATIVE NEGATIVE   Covid Negative  Hb 13.0 on 09/08/2019   Pelvic US 08/07/2019: Both abdominal and vaginal ultrasound necessary to evaluate pelvic anatomy. Grossly enlarged uterus 16 x 10 x 9 cm with numerous intramural and subserous fibroids, 12 fibroids counted, 5.11 cm, 5.94 cm, 4.17 cm, 3.22 cm, 1.64 cm,  1.67 cm, 1.87 cm, 1.61 cm, 1.94 cm, 2.15 cm, 3.56 cm, 1.98 cm,. Endometrium is distorted by central fibroids no obvious thickening, masses or abnormal blood flow. Uterus tender to palpation. Both ovaries normal size with normal follicular pattern. Bilateral cystic areas resembling resolving corpus luteal cysts right ovary 12.12 mm, left ovary  12.11 mm. No adnexal masses. Small amount of free fluid in the cul-de-sac.   Assessment/Plan:  45 y.o. G1P0001   1. Intramural and subserous leiomyoma of uterus Multiple large uterine fibroids causing pelvic pressure and menorrhagia.  Decision to proceed with hysterectomy, a TAH/bilateral salpingectomy is scheduled for September 15, 2019.  Preop preparation, procedure including risks as well as postop precautions reviewed thoroughly with patient.  Patient's questions answered.  On low dose BCPs until surgery to reduce heavy bleeding.  Patient voiced understanding and agreement with plan.  2. Menorrhagia with regular cycle As above.  3. Pelvic pressure in female As above.                        Patient was counseled as to the risk of surgery to include the following:  1. Infection (prohylactic antibiotics will be administered)  2. DVT/Pulmonary Embolism (prophylactic pneumo compression stockings will be used)  3.Trauma to internal organs requiring additional surgical procedure to repair any injury to internal organs requiring perhaps additional hospitalization days.  4.Hemmorhage requiring transfusion and blood products which carry risks such as anaphylactic reaction, hepatitis and AIDS  Patient had received literature information on the procedure scheduled and all her questions were answered and fully accepts all risk.  Marie-Lyne Hien Perreira 09/15/2019, 7:35 AM

## 2019-09-15 NOTE — Anesthesia Procedure Notes (Signed)
Procedure Name: Intubation Date/Time: 09/15/2019 7:50 AM Performed by: Mechele Claude, CRNA Pre-anesthesia Checklist: Patient identified, Emergency Drugs available, Suction available and Patient being monitored Patient Re-evaluated:Patient Re-evaluated prior to induction Oxygen Delivery Method: Circle system utilized Preoxygenation: Pre-oxygenation with 100% oxygen Induction Type: IV induction Ventilation: Mask ventilation without difficulty Laryngoscope Size: Mac and 3 Grade View: Grade II Tube type: Oral Tube size: 7.0 mm Number of attempts: 1 Airway Equipment and Method: Stylet and Oral airway Placement Confirmation: ETT inserted through vocal cords under direct vision,  positive ETCO2 and breath sounds checked- equal and bilateral Secured at: 21 cm Tube secured with: Tape Dental Injury: Teeth and Oropharynx as per pre-operative assessment  Comments: Able to visualize the arytenoids with cricoid pressure and head and neck flexion.

## 2019-09-16 DIAGNOSIS — D251 Intramural leiomyoma of uterus: Secondary | ICD-10-CM | POA: Diagnosis not present

## 2019-09-16 LAB — CBC
HCT: 33.1 % — ABNORMAL LOW (ref 36.0–46.0)
Hemoglobin: 10.7 g/dL — ABNORMAL LOW (ref 12.0–15.0)
MCH: 29.4 pg (ref 26.0–34.0)
MCHC: 32.3 g/dL (ref 30.0–36.0)
MCV: 90.9 fL (ref 80.0–100.0)
Platelets: 382 10*3/uL (ref 150–400)
RBC: 3.64 MIL/uL — ABNORMAL LOW (ref 3.87–5.11)
RDW: 13 % (ref 11.5–15.5)
WBC: 11.2 10*3/uL — ABNORMAL HIGH (ref 4.0–10.5)
nRBC: 0 % (ref 0.0–0.2)

## 2019-09-16 LAB — SURGICAL PATHOLOGY

## 2019-09-16 MED ORDER — OXYCODONE HCL 5 MG PO TABS
ORAL_TABLET | ORAL | Status: AC
  Filled 2019-09-16: qty 2

## 2019-09-16 MED ORDER — OXYCODONE-ACETAMINOPHEN 7.5-325 MG PO TABS: 1.0000 | ORAL_TABLET | Freq: Four times a day (QID) | ORAL | 0 refills | Status: DC | PRN

## 2019-09-16 NOTE — Progress Notes (Signed)
POD#1 TAH/Bilateral Salpingectomy Subjective: Patient reports tolerating PO, + flatus and no problems voiding.    Objective: I have reviewed patient's vital signs.  vital signs, intake and output, medications and labs.  Vitals:   09/16/19 0031 09/16/19 0543  BP: 101/63 (!) 94/59  Pulse: 61 (!) 52  Resp: 16 16  Temp: 98.4 F (36.9 C) 98.4 F (36.9 C)  SpO2: 95% 96%   I/O last 3 completed shifts: In: 3680 [P.O.:480; I.V.:3000; IV Piggyback:200] Out: 2600 [Urine:2300; Blood:300] No intake/output data recorded.  Results for orders placed or performed during the hospital encounter of 09/15/19 (from the past 24 hour(s))  CBC     Status: Abnormal   Collection Time: 09/16/19  5:22 AM  Result Value Ref Range   WBC 11.2 (H) 4.0 - 10.5 K/uL   RBC 3.64 (L) 3.87 - 5.11 MIL/uL   Hemoglobin 10.7 (L) 12.0 - 15.0 g/dL   HCT 33.1 (L) 36.0 - 46.0 %   MCV 90.9 80.0 - 100.0 fL   MCH 29.4 26.0 - 34.0 pg   MCHC 32.3 30.0 - 36.0 g/dL   RDW 13.0 11.5 - 15.5 %   Platelets 382 150 - 400 K/uL   nRBC 0.0 0.0 - 0.2 %    EXAM General: alert, cooperative and appears stated age Resp: clear to auscultation bilaterally Cardio: regular rate and rhythm GI: soft, non-tender; bowel sounds normal; no masses,  no organomegaly and incision: clean, dry and intact Extremities: no edema, redness or tenderness in the calves or thighs Vaginal Bleeding: none  Assessment: s/p Procedure(s): HYSTERECTOMY ABDOMINAL WITH SALPINGECTOMY: stable, progressing well and tolerating diet  Plan: Advance diet Encourage ambulation Discharge home  LOS: 0 days    Princess Bruins, MD 09/16/2019 7:53 AM       Princess Bruins, MD 7:50 AM 09/16/2019

## 2019-09-16 NOTE — Discharge Instructions (Signed)
Hysterectomy Information  A hysterectomy is a surgery in which the uterus is removed. The fallopian tubes and ovaries may be removed (bilateral salpingo-oophorectomy) as well. This procedure may be done to treat various medical problems. After the procedure, a woman will no longer have menstrual periods nor will she be able to become pregnant (sterile). What are the reasons for a hysterectomy? There are many reasons why a woman might have this procedure. They include:  Persistent, abnormal vaginal bleeding.  Long-term (chronic) pelvic pain or infection.  Endometriosis. This is when the lining of the uterus (endometrium) starts to grow outside the uterus.  Adenomyosis. This is when the endometrium starts to grow in the muscle of the uterus.  Pelvic organ prolapse. This is a condition in which the uterus falls down into the vagina.  Noncancerous growths in the uterus (uterine fibroids) that cause symptoms.  The presence of precancerous cells.  Cervical or uterine cancer. What are the different types of hysterectomy? There are three different types of hysterectomy:  Supracervical hysterectomy. In this type, the top part of the uterus is removed, but not the cervix.  Total hysterectomy. In this type, the uterus and cervix are removed.  Radical hysterectomy. In this type, the uterus, the cervix, and the tissue that holds the uterus in place (parametrium) are removed. What are the different ways a hysterectomy can be performed? There are many different ways a hysterectomy can be performed, including:  Abdominal hysterectomy. In this type, an incision is made in the abdomen. The uterus is removed through this incision.  Vaginal hysterectomy. In this type, an incision is made in the vagina. The uterus is removed through this incision. There are no abdominal incisions.  Conventional laparoscopic hysterectomy. In this type, three or four small incisions are made in the abdomen. A thin,  lighted tube with a camera (laparoscope) is inserted into one of the incisions. Other tools are put through the other incisions. The uterus is cut into small pieces. The small pieces are removed through the incisions or through the vagina.  Laparoscopically assisted vaginal hysterectomy (LAVH). In this type, three or four small incisions are made in the abdomen. Part of the surgery is performed laparoscopically and the other part is done vaginally. The uterus is removed through the vagina.  Robot-assisted laparoscopic hysterectomy. In this type, a laparoscope and other tools are inserted into three or four small incisions in the abdomen. A computer-controlled device is used to give the surgeon a 3D image and to help control the surgical instruments. This allows for more precise movements of surgical instruments. The uterus is cut into small pieces and removed through the incisions or removed through the vagina. Discuss the options with your health care provider to determine which type is the right one for you. What are the risks? Generally, this is a safe procedure. However, problems may occur, including:  Bleeding and risk of blood transfusion. Tell your health care provider if you do not want to receive any blood products.  Blood clots in the legs or lung.  Infection.  Damage to other structures or organs.  Allergic reactions to medicines.  Changing to an abdominal hysterectomy from one of the other techniques. What to expect after a hysterectomy  You will be given pain medicine.  You may need to stay in the hospital for 1- 2 days to recover, depending on the type of hysterectomy you had.  Follow your health care provider's instructions about exercise, driving, and general activities. Ask your   health care provider what activities are safe for you.  You will need to have someone with you for the first 3-5 days after you go home.  You will need to follow up with your surgeon in 2-4  weeks after surgery to evaluate your progress.  If the ovaries are removed, you will have early menopause symptoms such as hot flashes, night sweats, and insomnia.  If you had a hysterectomy for a problem that was not cancer or not a condition that could lead to cancer, then you no longer need Pap tests. However, even if you no longer need a Pap test, a regular pelvic exam is a good idea to make sure no other problems are developing. Questions to ask your health care provider  Is a hysterectomy medically necessary? Do I have other treatment options for my condition?  What are my options for hysterectomy procedure?  What organs and tissues need to be removed?  What are the risks?  What are the benefits?  How long will I need to stay in the hospital after the procedure?  How long will I need to recover at home?  What symptoms can I expect after the procedure? Summary  A hysterectomy is a surgery in which the uterus is removed. The fallopian tubes and ovaries may be removed (bilateral salpingo-oophorectomy) as well.  This procedure may be done to treat various medical problems. After the procedure, a woman will no longer have menstrual periods nor will she be able to become pregnant.  Discuss the options with your health care provider to determine which type of hysterectomy is the right one for you. This information is not intended to replace advice given to you by your health care provider. Make sure you discuss any questions you have with your health care provider. Document Revised: 06/08/2017 Document Reviewed: 08/02/2016 Elsevier Patient Education  2020 [employer]  

## 2019-09-16 NOTE — Op Note (Signed)
Operative Note  09/15/2019  7:47 AM  PATIENT:  Theresa Noble  44 y.o. female  PRE-OPERATIVE DIAGNOSIS:  Large fibroids, menorrhagia, pain  POST-OPERATIVE DIAGNOSIS:  Large fibroids, menorrhagia, pain  PROCEDURE:  Procedure(s): HYSTERECTOMY ABDOMINAL WITH SALPINGECTOMY  SURGEON:  Surgeon(s): Princess Bruins, MD  ANESTHESIA:   general  FINDINGS: Large myomatous uterus with utero-vesical adhesions, normal ovaries bilaterally.  DESCRIPTION OF OPERATION: Under general anesthesia with endotracheal intubation the patient is in decubitus dorsal position.  She is prepped with DuraPrep on the abdomen and with Betadine on the suprapubic and vulvar areas.  The Foley is inserted in the bladder.  The patient is draped as usual.  Timeout is done.  The subcutaneous tissue is infiltrated with Marcaine one quarter plain at the site of a previous C-section scar.  We make a Pfannenstiel incision at that level with the scalpel.  The adipose tissue and the aponeurosis are open with the scalpel in the midline and then the aponeurosis is opened transversely on either side using Mayo scissors.  The aponeurosis was separated from the recti muscles at the midline.  The parietal peritoneum is opened longitudinally with Metzenbaums scissors.  Inspection of the abdomen reveals no abnormality except for the large myomatous uterus extending to the abdomen.  Both ovaries are normal in size and appearance.  We are able to exteriorize the uterus at the skin.  Pictures are taken.  We start on the left side, cauterizing and sectioning the mesosalpinx with the LigaSure instrument, we then cauterized and sectioned the utero-ovarian ligament and finally cauterized and section the left round ligament.  The visceral peritoneum is opened with Metzenbaums scissors anteriorly and we started distending the bladder.  We proceeded exactly the same way on the right side.  We then put in place the ball for retractor with 3 laps to  retain the bowels in the abdomen.  The bladder retractor was also put in place.  The bladder was then further descended past the cervix.  That dissection was done with precaution as ideations were present between the bladder and the lower uterine segment secondary to the previous C-section.  We skeletonized the uterine arteries on either side using the Metzenbaums scissors.  We then used the LigaSure instrument to cauterize and section the uterine arteries on either side.  We used the scalpel to section the uterus above that level in order to pursue the surgery with more ease and better vision.  We descended on either side of the cervix cauterizing and sectioning with the LigaSure instrument.  When the angle of the vagina was reached, we clamped first the right side and section with Mayo scissors.  We used a Vicryl 0 with a Heaney stitch to secure the angle which was kept on a hemostat.  We proceeded the same way on the left side.  The vagina was then sectioned with scissors anteriorly and posteriorly to completely detached the cervix which was sent to pathology together with the uterus and both tubes.  The anterior and posterior vagina were grasped with cokers.  A suspension stitch was done at each vaginal angle attaching the vagina with the angle stitch that included the uterosacral ligament of the respective side.  We then closed the vagina with figure-of-eight's with Vicryl 0.  We irrigated and suction the pelvic cavity.  Hemostasis was completed at the left ovarian pedicle with the LigaSure at the peritoneum.  The Bovie was used to complete hemostasis at the anterior vagina as needed.  Hemostasis was  adequate at all levels.  The bowel for retractor and the 3 laps were removed.  Arista was added.  We verified hemostasis at the abdominal wall and completed it were necessary with the Bovie.  We closed the aponeurosis with 2 half running sutures of Vicryl 0.  The subcutaneous tissue was infiltrated with Exparel.  A  plain suture was added at the adipose tissue.  The skin was closed with a subcuticular suture of Monocryl 4-0.  Dermabond was added.  The patient was brought to recovery room in good and stable status.  ESTIMATED BLOOD LOSS: 300 mL   Intake/Output Summary (Last 24 hours) at 09/16/2019 0747 Last data filed at 09/16/2019 0236 Gross per 24 hour  Intake 3580 ml  Output 2600 ml  Net 980 ml     BLOOD ADMINISTERED:none   LOCAL MEDICATIONS USED:  MARCAINE/Experell  SPECIMEN:  Source of Specimen:  Uterus/Cervix with bilateral tubes   DISPOSITION OF SPECIMEN:  PATHOLOGY  COUNTS:  YES  PLAN OF CARE: Transfer to PACU  Marie-Lyne LavoieMD7:47 AM

## 2019-09-16 NOTE — Anesthesia Postprocedure Evaluation (Signed)
Anesthesia Post Note  Patient: Theresa Noble [facility]  Procedure(s) Performed: HYSTERECTOMY ABDOMINAL WITH SALPINGECTOMY (Bilateral Abdomen)     Patient location during evaluation: PACU Anesthesia Type: General Level of consciousness: awake and alert Pain management: pain level controlled Vital Signs Assessment: post-procedure vital signs reviewed and stable Respiratory status: spontaneous breathing, nonlabored ventilation, respiratory function stable and patient connected to nasal cannula oxygen Cardiovascular status: blood pressure returned to baseline and stable Postop Assessment: no apparent nausea or vomiting Anesthetic complications: no    Last Vitals:  Vitals:   09/16/19 0543 09/16/19 0809  BP: (!) 94/59 (!) 93/53  Pulse: (!) 52 (!) 59  Resp: 16 18  Temp: 36.9 C 36.8 C  SpO2: 96% 97%    Last Pain:  Vitals:   09/16/19 0809  TempSrc:   PainSc: 2                  Keith Cancio

## 2019-09-16 NOTE — Discharge Summary (Signed)
Physician Discharge Summary  Patient ID: Theresa Noble MRN: [id number] DOB/AGE: 11-04-1975 44 y.o.  Admit date: 09/15/2019 Discharge date: 09/16/2019  Admission Diagnoses: Postoperative state [Z98.890]   Discharge Diagnoses: Same Active Problems:   Postoperative state   Discharged Condition: good  Consults:None  Significant Diagnostic Studies: labs: Hb 10.7 Postop  Treatments:surgery: Total Abdominal Hysterectomy with Bilateral Salpingectomy  Vitals:   09/16/19 0031 09/16/19 0543  BP: 101/63 (!) 94/59  Pulse: 61 (!) 52  Resp: 16 16  Temp: 98.4 F (36.9 C) 98.4 F (36.9 C)  SpO2: 95% 96%     No intake/output data recorded.   Hospital Course: Good  Discharge Exam: Normal postop  Disposition: Home     Allergies as of 09/16/2019   No Known Allergies     Medication List    STOP taking these medications   norethindrone-ethinyl estradiol 1-20 MG-MCG tablet Commonly known as: LOESTRIN FE     TAKE these medications   IRON PO Take 2 tablets by mouth daily.   multivitamin tablet Take 1 tablet by mouth daily.   oxyCODONE-acetaminophen 7.5-325 MG tablet Commonly known as: Percocet Take 1 tablet by mouth every 6 (six) hours as needed for severe pain.        Follow-up Information    Princess Bruins, MD Follow up in 3 week(s).   Specialty: Obstetrics and Gynecology Contact information: [address] [place] [address] [phone]            Signed: Princess Bruins 09/16/2019, 8:00 AM

## 2019-09-17 NOTE — Progress Notes (Signed)
Unable to leave message

## 2019-09-30 ENCOUNTER — Encounter: Payer: Self-pay | Admitting: Anesthesiology

## 2019-10-01 DIAGNOSIS — Z0289 Encounter for other administrative examinations: Secondary | ICD-10-CM

## 2019-10-06 ENCOUNTER — Other Ambulatory Visit: Payer: Self-pay

## 2019-10-07 ENCOUNTER — Encounter: Payer: Self-pay | Admitting: Obstetrics & Gynecology

## 2019-10-07 ENCOUNTER — Ambulatory Visit: Payer: BC Managed Care – PPO | Admitting: Obstetrics & Gynecology

## 2019-10-07 VITALS — BP 138/82

## 2019-10-07 DIAGNOSIS — Z09 Encounter for follow-up examination after completed treatment for conditions other than malignant neoplasm: Secondary | ICD-10-CM

## 2019-10-07 NOTE — Progress Notes (Signed)
    Theresa Noble [facility] [birth year] [id number]        44 y.o.  G1P0001   RP: Postop TAH/Bilateral Salpingectomy 09/15/2019  HPI: Very good postop healing.  Incision well closed with no discharge, no redness and no pain.  No current abdominal pelvic pain.  No vaginal bleeding and no vaginal discharge.  Urine and bowel movements normal.  No fever.   OB History  Gravida Para Term Preterm AB Living  1 1     0 1  SAB TAB Ectopic Multiple Live Births      0        # Outcome Date GA Lbr Len/2nd Weight Sex Delivery Anes PTL Lv  1 Para             Past medical history,surgical history, problem list, medications, allergies, family history and social history were all reviewed and documented in the EPIC chart.   Directed ROS with pertinent positives and negatives documented in the history of present illness/assessment and plan.  Exam:  Vitals:   10/07/19 0934  BP: 138/82   General appearance:  Normal  Abdomen: Soft, NT.  BS present.  Incision well closed, no erythema.  Gynecologic exam: Vulva normal.  Speculum:  Vaginal vault well closed.  No bleeding, no vaginal discharge.  Patho 09/15/2019: FINAL MICROSCOPIC DIAGNOSIS:   A. UTERUS, CERVIX AND BILATERAL FALLOPIAN TUBES, HYSTERECTOMY WITH  BILATERAL SALPINGECTOMY:  Cervix:  - Mild acute and chronic cervicitis.   Uterus:  - Endometrium: Attenuated inactive endometrium.  - Myometrium: Adenomyosis. Leiomyomata.  - Serosa: No significant histopathologic findings.   Adnexa:  - Fallopian tubes: No significant histopathologic findings.     Assessment/Plan:  44 y.o. G1P0001   1. Status post gynecological surgery, follow-up exam Good postop healing.  No complication.  Pathology benign, reviewed with patient.  Precautions reviewed, can progressively increase physical activities but no heavy lifting or physical activities involving the abdominal muscles.  No intercourse until next visit.  F/U 4 weeks for vaginal vault  check.  Princess Bruins MD, 9:44 AM 10/07/2019

## 2019-10-08 ENCOUNTER — Encounter: Payer: Self-pay | Admitting: Obstetrics & Gynecology

## 2019-10-08 NOTE — Patient Instructions (Signed)
1. Status post gynecological surgery, follow-up exam Good postop healing.  No complication.  Pathology benign, reviewed with patient.  Precautions reviewed, can progressively increase physical activities but no heavy lifting or physical activities involving the abdominal muscles.  No intercourse until next visit.  F/U 4 weeks for vaginal vault check.  Theresa Noble, it was a pleasure seeing you today!

## 2019-10-09 ENCOUNTER — Telehealth: Payer: Self-pay

## 2019-10-09 NOTE — Telephone Encounter (Signed)
Yes, scan of the photo of her uterus with the Fibroids is in Media under Photo 09/24/2019.

## 2019-10-09 NOTE — Telephone Encounter (Signed)
I emailed patient back and let her know photo was scanned into system and I can print it out for her but will be black and white.

## 2019-10-09 NOTE — Telephone Encounter (Signed)
Hi Melton Walls,  During my visit yesterday I forgot to ask Dr. Dellis Filbert to see a photo of the fibroids. Does she have it on file?  Many thanks!

## 2019-10-31 ENCOUNTER — Other Ambulatory Visit: Payer: Self-pay

## 2019-11-03 ENCOUNTER — Other Ambulatory Visit: Payer: Self-pay

## 2019-11-03 ENCOUNTER — Encounter: Payer: Self-pay | Admitting: Obstetrics & Gynecology

## 2019-11-03 ENCOUNTER — Ambulatory Visit: Payer: BC Managed Care – PPO | Admitting: Obstetrics & Gynecology

## 2019-11-03 VITALS — BP 126/82

## 2019-11-03 DIAGNOSIS — Z09 Encounter for follow-up examination after completed treatment for conditions other than malignant neoplasm: Secondary | ICD-10-CM

## 2019-11-03 NOTE — Progress Notes (Signed)
    Daniellemarie Niklas [facility] 29-Jun-1976 [id number]        44 y.o.  G1P0001 Married  RP: Post TAH/Bilateral Salpingectomy on 09/15/2019  HPI: Abdominal incision well closed with no pain or tenderness.  Doing well with no abdominal pelvic pain, occasional discomfort when ambulating.  No vaginal bleeding or abnormal vaginal discharge.  Urine and bowel movements normal.  No fever.   OB History  Gravida Para Term Preterm AB Living  1 1     0 1  SAB TAB Ectopic Multiple Live Births      0        # Outcome Date GA Lbr Len/2nd Weight Sex Delivery Anes PTL Lv  1 Para             Past medical history,surgical history, problem list, medications, allergies, family history and social history were all reviewed and documented in the EPIC chart.   Directed ROS with pertinent positives and negatives documented in the history of present illness/assessment and plan.  Exam:  Vitals:   11/03/19 1436  BP: 126/82   General appearance:  Normal  Abdomen:  Incision well healed.  Closed, no induration, no erythema.  Gynecologic exam: Vulva normal.  Speculum:  Vaginal vault well closed.  No bleeding, normal secretions.  Bimanual exam:  Vaginal vault closed, no induration.  No pelvic mass or tenderness.  Pathology benign with fibroids and adenomyosis.   Assessment/Plan:  44 y.o. G1P0001   1. Status post gynecological surgery, follow-up exam Good healing post TAH/bilateral salpingectomy.  Pfannenstiel incision well-healed as well as the vaginal vault.  May resume physical activities.  Recommend waiting 1 additional week before resuming sexual activities.  Will follow up in 1 year for annual gynecologic exam.  Princess Bruins MD, 2:41 PM 11/03/2019

## 2019-11-03 NOTE — Patient Instructions (Signed)
1. Status post gynecological surgery, follow-up exam Good healing post TAH/bilateral salpingectomy.  Pfannenstiel incision well-healed as well as the vaginal vault.  May resume physical activities.  Recommend waiting 1 additional week before resuming sexual activities.  Will follow up in 1 year for annual gynecologic exam.  Theresa Noble, it was a pleasure seeing you today!

## 2021-07-13 ENCOUNTER — Other Ambulatory Visit: Payer: Self-pay | Admitting: Physician Assistant

## 2021-07-13 ENCOUNTER — Other Ambulatory Visit: Payer: Self-pay | Admitting: Family Medicine

## 2021-07-13 DIAGNOSIS — Z1231 Encounter for screening mammogram for malignant neoplasm of breast: Secondary | ICD-10-CM

## 2021-10-17 ENCOUNTER — Other Ambulatory Visit: Payer: Self-pay | Admitting: Family Medicine

## 2021-10-17 DIAGNOSIS — Z1231 Encounter for screening mammogram for malignant neoplasm of breast: Secondary | ICD-10-CM

## 2021-10-24 ENCOUNTER — Ambulatory Visit
Admission: RE | Admit: 2021-10-24 | Discharge: 2021-10-24 | Disposition: A | Discharge status: Home / Self Care | Payer: BC Managed Care – PPO | Source: Ambulatory Visit

## 2021-10-24 DIAGNOSIS — Z1231 Encounter for screening mammogram for malignant neoplasm of breast: Secondary | ICD-10-CM

## 2021-10-25 ENCOUNTER — Other Ambulatory Visit: Payer: Self-pay | Admitting: Family Medicine

## 2021-10-25 DIAGNOSIS — R928 Other abnormal and inconclusive findings on diagnostic imaging of breast: Secondary | ICD-10-CM

## 2021-11-10 ENCOUNTER — Other Ambulatory Visit: Payer: BC Managed Care – PPO

## 2021-12-16 ENCOUNTER — Ambulatory Visit (INDEPENDENT_AMBULATORY_CARE_PROVIDER_SITE_OTHER): Payer: BC Managed Care – PPO | Admitting: Obstetrics & Gynecology

## 2021-12-16 ENCOUNTER — Encounter: Payer: Self-pay | Admitting: Obstetrics & Gynecology

## 2021-12-16 ENCOUNTER — Other Ambulatory Visit: Payer: Self-pay | Admitting: Obstetrics & Gynecology

## 2021-12-16 VITALS — BP 104/72 | HR 76 | Ht 65.75 in | Wt 196.0 lb

## 2021-12-16 DIAGNOSIS — Z01419 Encounter for gynecological examination (general) (routine) without abnormal findings: Secondary | ICD-10-CM

## 2021-12-16 DIAGNOSIS — R928 Other abnormal and inconclusive findings on diagnostic imaging of breast: Secondary | ICD-10-CM

## 2021-12-16 DIAGNOSIS — Z90710 Acquired absence of both cervix and uterus: Secondary | ICD-10-CM

## 2021-12-16 NOTE — Progress Notes (Unsigned)
Theresa Noble [facility] October 21, 1975 [id number]   History:    46 y.o. G1P1L1 Married.  Daughter graduating HS tomorrow.  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  RP:  Established patient presenting for annual gyn exam   HPI: TAH/Bilateral Salpingectomy on 09/15/2019.  Patho benign.  Will repeat a Pap test at 5 years in 2026.  Doing very well, no abdominopelvic pain.  Breasts normal.  Screening mammo Left Neg, Right incomplete, needs a Rt breast US, sending the order, patient will schedule.  BMI 31.88.  F[facility] with [facility].  Past medical history,surgical history, family history and social history were all reviewed and documented in the EPIC chart.  Gynecologic History Patient's last menstrual period was 08/17/2019.  Obstetric History OB History  Gravida Para Term Preterm AB Living  '1 1 1   '$ 0 1  SAB IAB Ectopic Multiple Live Births               # Outcome Date GA Lbr Len/2nd Weight Sex Delivery Anes PTL Lv  1 Term              ROS: A ROS was performed and pertinent positives and negatives are included in the history.  GENERAL: No fevers or chills. HEENT: No change in vision, no earache, sore throat or sinus congestion. NECK: No pain or stiffness. CARDIOVASCULAR: No chest pain or pressure. No palpitations. PULMONARY: No shortness of breath, cough or wheeze. GASTROINTESTINAL: No abdominal pain, nausea, vomiting or diarrhea, melena or bright red blood per rectum. GENITOURINARY: No urinary frequency, urgency, hesitancy or dysuria. MUSCULOSKELETAL: No joint or muscle pain, no back pain, no recent trauma. DERMATOLOGIC: No rash, no itching, no lesions. ENDOCRINE: No polyuria, polydipsia, no heat or cold intolerance. No recent change in weight. HEMATOLOGICAL: No anemia or easy bruising or bleeding. NEUROLOGIC: No headache, seizures, numbness, tingling or weakness. PSYCHIATRIC: No depression, no loss of interest in normal activity or change in sleep pattern.     Exam:   BP 104/72   Pulse 76    Ht 5' 5.75" (1.67 m)   Wt 196 lb (88.9 kg)   LMP 08/17/2019   SpO2 98%   BMI 31.88 kg/m   Body mass index is 31.88 kg/m.  General appearance : Well developed well nourished female. No acute distress HEENT: Eyes: no retinal hemorrhage or exudates,  Neck supple, trachea midline, no carotid bruits, no thyroidmegaly Lungs: Clear to auscultation, no rhonchi or wheezes, or rib retractions  Heart: Regular rate and rhythm, no murmurs or gallops Breast:Examined in sitting and supine position were symmetrical in appearance, no palpable masses or tenderness,  no skin retraction, no nipple inversion, no nipple discharge, no skin discoloration, no axillary or supraclavicular lymphadenopathy Abdomen: no palpable masses or tenderness, no rebound or guarding Extremities: no edema or skin discoloration or tenderness  Pelvic: Vulva: Normal             Vagina: No gross lesions or discharge  Cervix/Uterus absent  Adnexa  Without masses or tenderness  Anus: Normal   Assessment/Plan:  46 y.o. female for annual exam   1. Well female exam with routine gynecological exam TAH/Bilateral Salpingectomy on 09/15/2019.  Patho benign.  Will repeat a Pap test at 5 years in 2026.  Doing very well, no abdominopelvic pain.  Breasts normal.  Screening mammo Left Neg, Right incomplete, needs a Rt breast US, sending the order, patient will schedule.  BMI 31.88.  Fasting Health Labs with [facility].  2. S/P TAH (  total abdominal hysterectomy)   Princess Bruins MD, 2:41 PM 12/16/2021

## 2021-12-18 ENCOUNTER — Encounter: Payer: Self-pay | Admitting: Obstetrics & Gynecology

## 2021-12-20 ENCOUNTER — Other Ambulatory Visit: Payer: BC Managed Care – PPO

## 2021-12-26 ENCOUNTER — Ambulatory Visit
Admission: RE | Admit: 2021-12-26 | Discharge: 2021-12-26 | Disposition: A | Discharge status: Home / Self Care | Payer: BC Managed Care – PPO | Source: Ambulatory Visit | Attending: Obstetrics & Gynecology | Admitting: Obstetrics & Gynecology

## 2021-12-26 DIAGNOSIS — R928 Other abnormal and inconclusive findings on diagnostic imaging of breast: Secondary | ICD-10-CM

## 2023-04-10 ENCOUNTER — Other Ambulatory Visit: Payer: Self-pay | Admitting: Family Medicine

## 2023-04-10 DIAGNOSIS — Z1231 Encounter for screening mammogram for malignant neoplasm of breast: Secondary | ICD-10-CM

## 2023-04-24 ENCOUNTER — Ambulatory Visit
Admission: RE | Admit: 2023-04-24 | Discharge: 2023-04-24 | Disposition: A | Discharge status: Home / Self Care | Payer: BC Managed Care – PPO | Source: Ambulatory Visit

## 2023-04-24 DIAGNOSIS — Z1231 Encounter for screening mammogram for malignant neoplasm of breast: Secondary | ICD-10-CM

## 2023-08-31 IMAGING — US US BREAST*R* LIMITED INC AXILLA
1 series · 5 of 5 positions shown · non-contrast
Comparison: Previous exams.

CLINICAL DATA: 46-year-old female presenting as a recall from
screening for possible right breast mass.

EXAM:
ULTRASOUND OF THE RIGHT BREAST

[Series 1: us breast*right* limited inc axilla · 0.06mm/px · 5 of 5 slices shown]
[im 1/5]
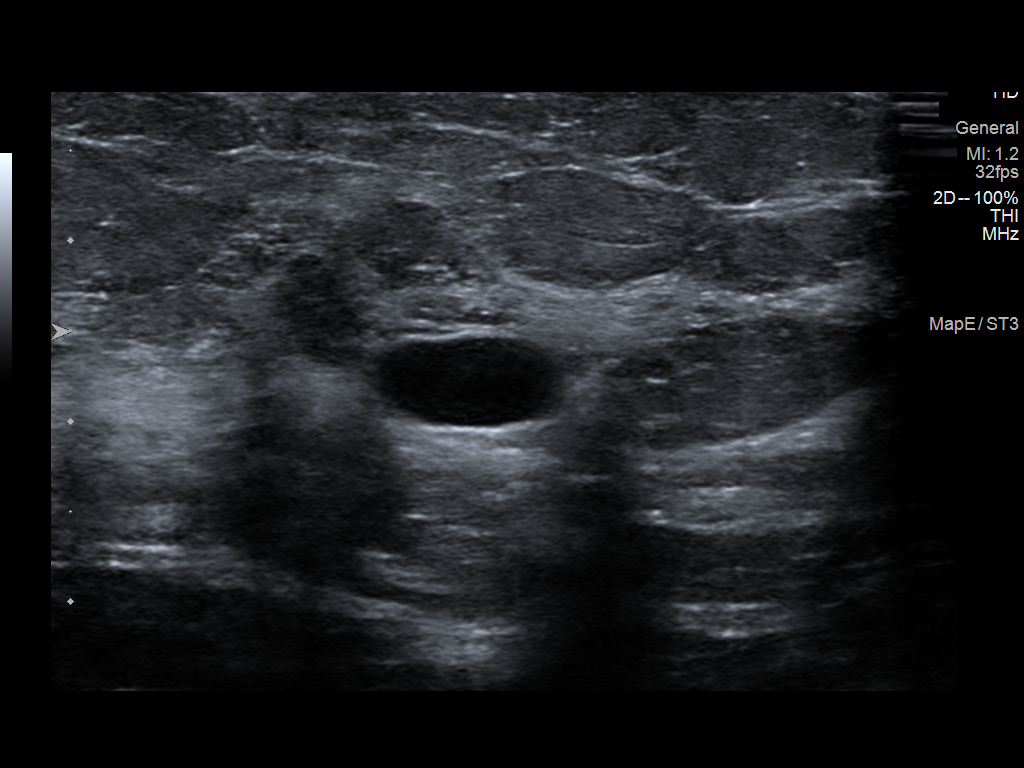
[im 2/5]
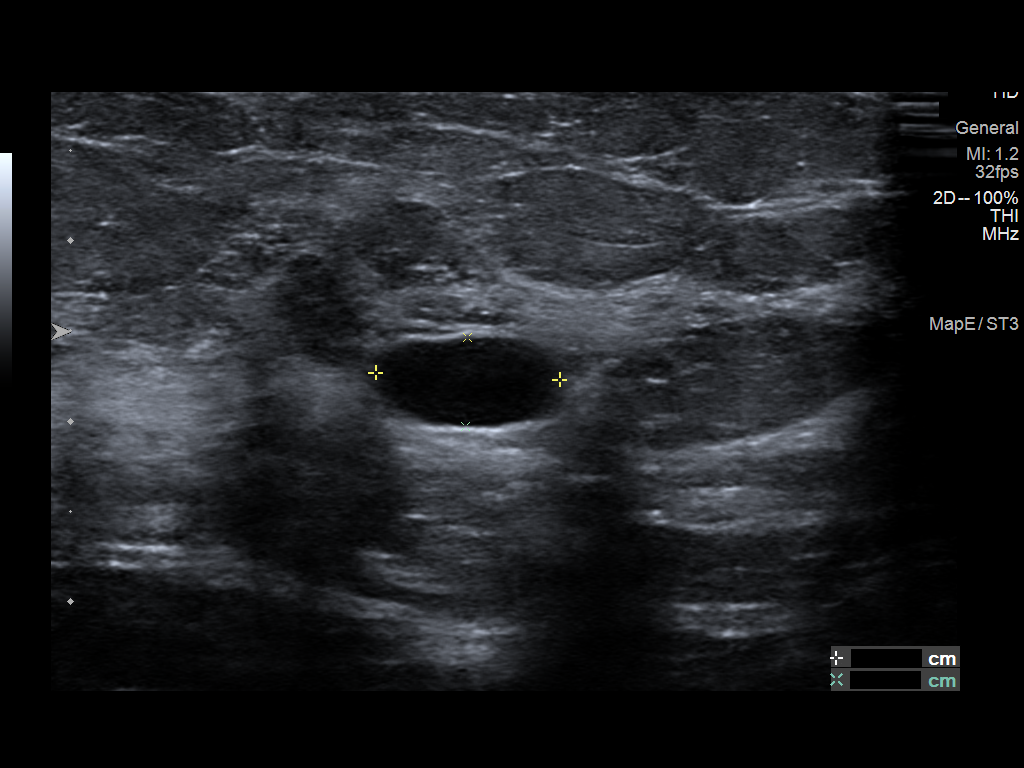
[im 3/5]
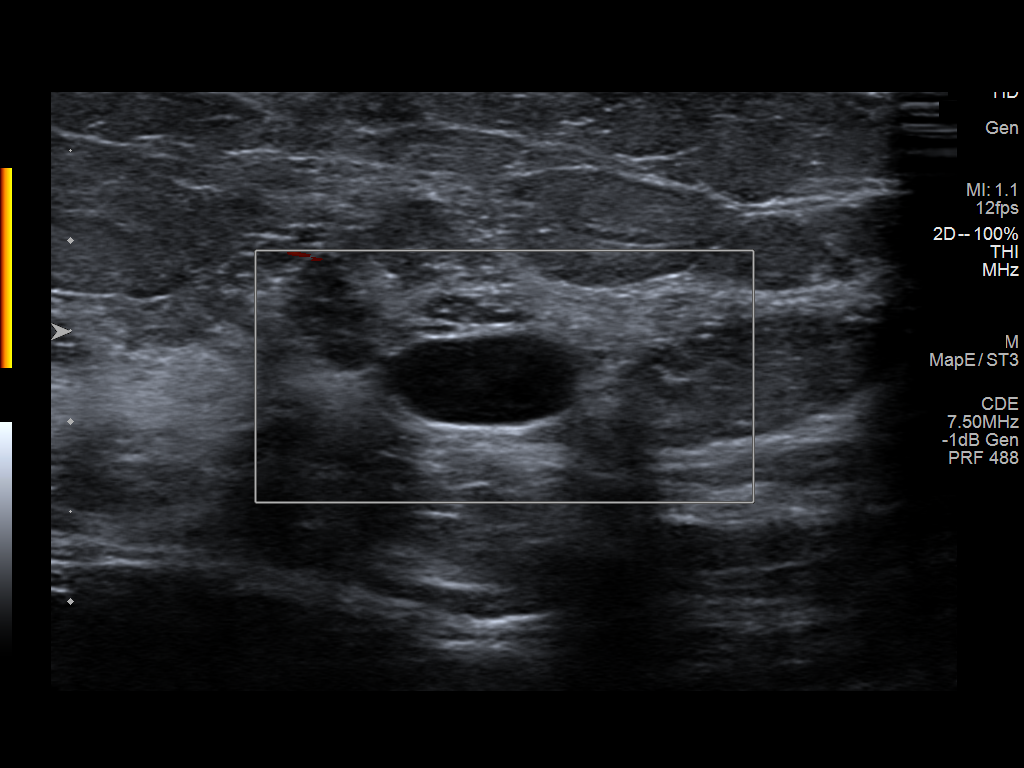
[im 4/5]
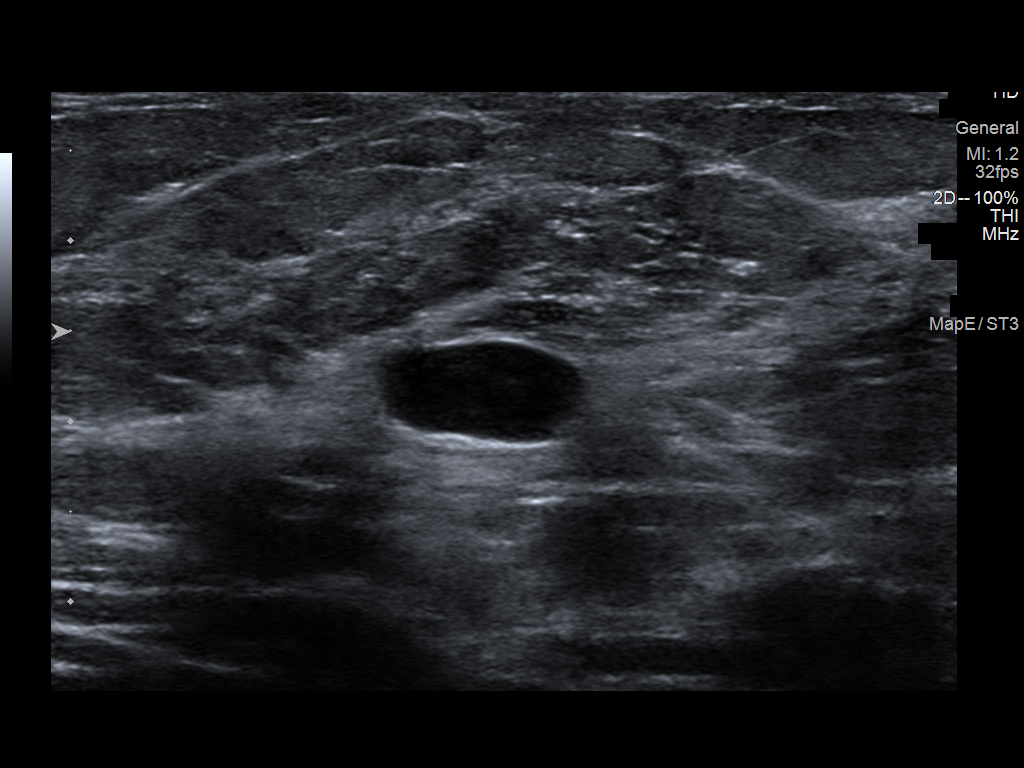
[im 5/5]
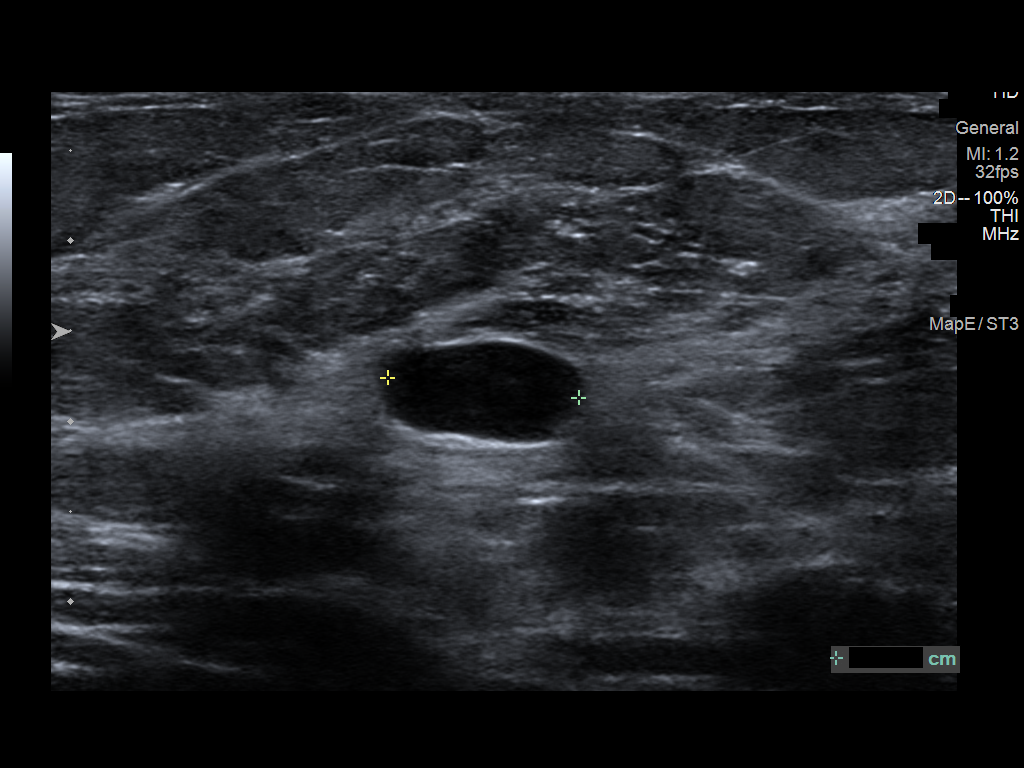

[5 of 5 positions shown; findings below may reference images not displayed]

FINDINGS: Targeted ultrasound is performed in the right breast at 9 o'clock 10
cm from the nipple demonstrating an oval circumscribed anechoic mass
with posterior enhancement measuring 1.0 x 0.5 x 1.1 cm, consistent
with a benign cyst. No internal vascularity. This corresponds to the
mammographic finding.
IMPRESSION: Benign simple cyst in the right breast at 9 o'clock.

RECOMMENDATION:
Screening mammogram in one year.(Code:[id number])

I have discussed the findings and recommendations with the patient.
If applicable, a reminder letter will be sent to the patient
regarding the next appointment.

BI-RADS CATEGORY  2: Benign.

## 2024-01-25 ENCOUNTER — Ambulatory Visit
Admission: EM | Admit: 2024-01-25 | Discharge: 2024-01-25 | Disposition: A | Discharge status: Home / Self Care | Attending: Emergency Medicine | Admitting: Emergency Medicine

## 2024-01-25 ENCOUNTER — Encounter: Payer: Self-pay | Admitting: Emergency Medicine

## 2024-01-25 DIAGNOSIS — R21 Rash and other nonspecific skin eruption: Secondary | ICD-10-CM | POA: Diagnosis not present

## 2024-01-25 MED ORDER — PREDNISONE 10 MG (21) PO TBPK: ORAL_TABLET | Freq: Every day | ORAL | 0 refills | Status: DC

## 2024-01-25 MED ORDER — FLUCONAZOLE 150 MG PO TABS: 150.0000 mg | ORAL_TABLET | ORAL | 0 refills | Status: DC | PRN

## 2024-01-25 MED ORDER — DEXAMETHASONE SODIUM PHOSPHATE 10 MG/ML IJ SOLN
10.0000 mg | Freq: Once | INTRAMUSCULAR | Status: AC
  Administered 2024-01-25: 10 mg via INTRAMUSCULAR

## 2024-01-25 NOTE — ED Triage Notes (Signed)
 Patient complains of rash on bilateral arms and legs that itch x 6 days. Patient has taken benadryl at night with some relief.

## 2024-01-25 NOTE — Discharge Instructions (Addendum)
 Today you are being treated for a rash that appears inflammatory, no signs of infection  You have been given an injection of steroids today in the office today to help reduce the inflammatory process that occurs with this rash which will help minimize your itching as well as begin to clear  Starting tomorrow take prednisone every morning with food as directed, to continue the above process  You may continue use of topical calamine or Benadryl cream to help manage itching, you may also continue oral Benadryl  Please avoid long exposures to heat such as a hot steamy shower or being outside as this may cause further irritation to your rash  You may follow-up with his urgent care as needed if symptoms persist or worsen

## 2024-01-25 NOTE — ED Provider Notes (Signed)
 CAY RALPH PELT    CSN: [id number] Arrival date & time: 01/25/24  1356      History   Chief Complaint Chief Complaint  Patient presents with   Rash    HPI Theresa Noble is a 48 y.o. female.   Patient presents for evaluation of a pruritic rash to the bilateral upper and lower extremities beginning 6 days ago.  Endorses she had traveled to the [place] area and attended a outside event in the hot sun, symptoms started 1 day later.  Denies drainage or fever.  Denies changes in toiletries, diet or known contact with similar symptoms.  Has attempted use of Benadryl.  Past Medical History:  Diagnosis Date   Fibroid     Patient Active Problem List   Diagnosis Date Noted   Postoperative state 09/15/2019   Fibroid uterus 08/11/2019    Past Surgical History:  Procedure Laterality Date   CESAREAN SECTION  2005   DUE TO PIH   HYSTERECTOMY ABDOMINAL WITH SALPINGECTOMY Bilateral 09/15/2019   Procedure: HYSTERECTOMY ABDOMINAL WITH SALPINGECTOMY;  Surgeon: Lavoie, Marie-Lyne, MD;  Location: [facility];  Service: Gynecology;  Laterality: Bilateral;  to follow in [facility] block requests 2 hours Tracie to RNFA confirmed on 09/01/19 CS    OB History     Gravida  1   Para  1   Term  1   Preterm      AB  0   Living  1      SAB      IAB      Ectopic      Multiple      Live Births               Home Medications    Prior to Admission medications   Medication Sig Start Date End Date Taking? Authorizing Provider  fluconazole (DIFLUCAN) 150 MG tablet Take 1 tablet (150 mg total) by mouth every three (3) days as needed for up to 2 doses. 01/25/24  Yes Judy Pollman R, NP  predniSONE (STERAPRED UNI-PAK 21 TAB) 10 MG (21) TBPK tablet Take by mouth daily. Take 6 tabs by mouth daily  for 1 days, then 5 tabs for 1 days, then 4 tabs for 1 days, then 3 tabs for 1 days, 2 tabs for 1 days, then 1 tab by mouth daily for 1 days 01/25/24  Yes  Eulla Kochanowski R, NP  Ferrous Sulfate (IRON PO) Take 2 tablets by mouth daily.    [provider]  Multiple Vitamin (MULTIVITAMIN) tablet Take 1 tablet by mouth daily.      [provider]    Family History Family History  Problem Relation Age of Onset   Breast cancer Mother        DIED IN 72'S   Cancer Father        LEUKEMIA   Diabetes Paternal Aunt    Diabetes Paternal Grandfather     Social History Social History   Tobacco Use   Smoking status: Never   Smokeless tobacco: Never  Vaping Use   Vaping status: Never Used  Substance Use Topics   Alcohol use: No   Drug use: No     Allergies   Patient has no known allergies.   Review of Systems Review of Systems  Skin:  Positive for rash.     Physical Exam Triage Vital Signs ED Triage Vitals  Encounter Vitals Group     BP 01/25/24 1420 113/72  Girls Systolic BP Percentile --      Girls Diastolic BP Percentile --      Boys Systolic BP Percentile --      Boys Diastolic BP Percentile --      Pulse Rate 01/25/24 1420 76     Resp 01/25/24 1420 18     Temp 01/25/24 1420 98.9 F (37.2 C)     Temp Source 01/25/24 1420 Oral     SpO2 01/25/24 1420 97 %     Weight --      Height --      Head Circumference --      Peak Flow --      Pain Score 01/25/24 1403 0     Pain Loc --      Pain Education --      Exclude from Growth Chart --    No data found.  Updated Vital Signs BP 113/72 (BP Location: Left Arm)   Pulse 76   Temp 98.9 F (37.2 C) (Oral)   Resp 18   LMP 08/17/2019   SpO2 97%   Visual Acuity Right Eye Distance:   Left Eye Distance:   Bilateral Distance:    Right Eye Near:   Left Eye Near:    Bilateral Near:     Physical Exam Constitutional:      Appearance: Normal appearance.  Eyes:     Extraocular Movements: Extraocular movements intact.  Pulmonary:     Effort: Pulmonary effort is normal.  Skin:    Comments: Erythematous papular rash present to the bilateral upper  and lower extremities  Neurological:     Mental Status: She is alert and oriented to person, place, and time.      UC Treatments / Results  Labs (all labs ordered are listed, but only abnormal results are displayed) Labs Reviewed - No data to display  EKG   Radiology No results found.  Procedures Procedures (including critical care time)  Medications Ordered in UC Medications  dexamethasone  (DECADRON ) injection 10 mg (10 mg Intramuscular Given 01/25/24 1427)    Initial Impression / Assessment and Plan / UC Course  I have reviewed the triage vital signs and the nursing notes.  Pertinent labs & imaging results that were available during my care of the patient were reviewed by me and considered in my medical decision making (see chart for details).  Rash  Appears inflammatory, unknown etiology, no signs of infection, Decadron  IM given and prescribed oral prednisone for home use, discussed supportive care for management of pruritus and advised follow-up as needed Final Clinical Impressions(s) / UC Diagnoses   Final diagnoses:  Rash and nonspecific skin eruption     Discharge Instructions      Today you are being treated for a rash that appears inflammatory, no signs of infection  You have been given an injection of steroids today in the office today to help reduce the inflammatory process that occurs with this rash which will help minimize your itching as well as begin to clear  Starting tomorrow take prednisone every morning with food as directed, to continue the above process  You may continue use of topical calamine or Benadryl cream to help manage itching, you may also continue oral Benadryl  Please avoid long exposures to heat such as a hot steamy shower or being outside as this may cause further irritation to your rash  You may follow-up with his urgent care as needed if symptoms persist or worsen    ED  Prescriptions     Medication Sig Dispense Auth.  Provider   predniSONE (STERAPRED UNI-PAK 21 TAB) 10 MG (21) TBPK tablet Take by mouth daily. Take 6 tabs by mouth daily  for 1 days, then 5 tabs for 1 days, then 4 tabs for 1 days, then 3 tabs for 1 days, 2 tabs for 1 days, then 1 tab by mouth daily for 1 days 21 tablet Ranulfo Kall R, NP   fluconazole (DIFLUCAN) 150 MG tablet Take 1 tablet (150 mg total) by mouth every three (3) days as needed for up to 2 doses. 2 tablet Claudius Mich R, NP      PDMP not reviewed this encounter.   Teresa Shelba SAUNDERS, NP 01/25/24 1441

## 2024-04-16 ENCOUNTER — Encounter: Payer: Self-pay | Admitting: Podiatry

## 2024-04-16 ENCOUNTER — Ambulatory Visit (INDEPENDENT_AMBULATORY_CARE_PROVIDER_SITE_OTHER)

## 2024-04-16 ENCOUNTER — Ambulatory Visit: Admitting: Podiatry

## 2024-04-16 DIAGNOSIS — M722 Plantar fascial fibromatosis: Secondary | ICD-10-CM

## 2024-04-16 MED ORDER — DICLOFENAC SODIUM 75 MG PO TBEC: 75.0000 mg | DELAYED_RELEASE_TABLET | Freq: Two times a day (BID) | ORAL | 2 refills | Status: AC

## 2024-04-16 MED ORDER — TRIAMCINOLONE ACETONIDE 10 MG/ML IJ SUSP
10.0000 mg | Freq: Once | INTRAMUSCULAR | Status: AC
Start: 2024-04-16 — End: 2024-04-16
  Administered 2024-04-16: 10 mg via INTRA_ARTICULAR

## 2024-04-16 NOTE — Progress Notes (Signed)
 Subjective:   Patient ID: Theresa Noble, female   DOB: 48 y.o.   MRN: [id number]   HPI Patient presents with severe pain in the left plantar heel stating it has been going on for a long time and is worsened over the last few months and is almost impossible for her to bear weight now.  States its gotten increasingly sore over this last few months.  Patient does not smoke likes to be active   Review of Systems  All other systems reviewed and are negative.       Objective:  Physical Exam Vitals and nursing note reviewed.  Constitutional:      Appearance: She is well-developed.  Pulmonary:     Effort: Pulmonary effort is normal.  Musculoskeletal:        General: Normal range of motion.  Skin:    General: Skin is warm.  Neurological:     Mental Status: She is alert.     Neurovascular status intact muscle strength found to be adequate range of motion adequate with patient found to have exquisite discomfort with severe edema of the left plantar fascia at the insertional point tendon calcaneus with fluid buildup noted.  Patient is unable to bear plantar weight     Assessment:  Acute plantar fasciitis left with inflammation fluid medial band     Plan:  H&P x-ray taken reviewed condition.  Due to the intensity of discomfort I have recommended complete immobilization due to the pain the patient is having and patient is placed in an air fracture walker to completely immobilize and take all plantar pressure off the heel.  I did go ahead today I first did sterile prep and injected the fascia at insertion 3 mg Kenalog 5 mg Xylocaine  with sterile dressing and placed on oral diclofenac  X-rays were negative for signs of fracture or other bony condition.  Minimal spur noted

## 2024-04-16 NOTE — Patient Instructions (Signed)

## 2024-04-17 ENCOUNTER — Ambulatory Visit: Admitting: Podiatry

## 2024-04-30 ENCOUNTER — Ambulatory Visit: Admitting: Podiatry

## 2024-05-12 ENCOUNTER — Ambulatory Visit: Admitting: Podiatry

## 2024-07-11 ENCOUNTER — Inpatient Hospital Stay (HOSPITAL_BASED_OUTPATIENT_CLINIC_OR_DEPARTMENT_OTHER): Admission: RE | Admit: 2024-07-11 | Source: Ambulatory Visit | Admitting: Radiology
